# Patient Record
Sex: Male | Born: 1940
Health system: Southern US, Community
[De-identification: ages and names within clinical notes are randomized; demographics above are authoritative.]

## PROBLEM LIST (undated history)

## (undated) DIAGNOSIS — H409 Unspecified glaucoma: Secondary | ICD-10-CM

## (undated) DIAGNOSIS — I10 Essential (primary) hypertension: Secondary | ICD-10-CM

## (undated) HISTORY — PX: EYE SURGERY: SHX253

## (undated) HISTORY — DX: Essential (primary) hypertension: I10

## (undated) HISTORY — DX: Unspecified glaucoma: H40.9

---

## 2012-05-04 DIAGNOSIS — H2510 Age-related nuclear cataract, unspecified eye: Secondary | ICD-10-CM | POA: Diagnosis not present

## 2013-05-09 DIAGNOSIS — H40019 Open angle with borderline findings, low risk, unspecified eye: Secondary | ICD-10-CM | POA: Diagnosis not present

## 2013-05-09 DIAGNOSIS — H2510 Age-related nuclear cataract, unspecified eye: Secondary | ICD-10-CM | POA: Diagnosis not present

## 2014-06-13 DIAGNOSIS — H2510 Age-related nuclear cataract, unspecified eye: Secondary | ICD-10-CM | POA: Diagnosis not present

## 2014-06-13 DIAGNOSIS — H40019 Open angle with borderline findings, low risk, unspecified eye: Secondary | ICD-10-CM | POA: Diagnosis not present

## 2014-06-13 DIAGNOSIS — H11159 Pinguecula, unspecified eye: Secondary | ICD-10-CM | POA: Diagnosis not present

## 2014-06-26 DIAGNOSIS — H2510 Age-related nuclear cataract, unspecified eye: Secondary | ICD-10-CM | POA: Diagnosis not present

## 2014-06-26 DIAGNOSIS — H40019 Open angle with borderline findings, low risk, unspecified eye: Secondary | ICD-10-CM | POA: Diagnosis not present

## 2014-10-12 HISTORY — PX: DENTAL SURGERY: SHX609

## 2015-03-22 ENCOUNTER — Encounter: Payer: Self-pay | Admitting: Emergency Medicine

## 2015-03-22 ENCOUNTER — Emergency Department (INDEPENDENT_AMBULATORY_CARE_PROVIDER_SITE_OTHER)
Admission: EM | Admit: 2015-03-22 | Discharge: 2015-03-22 | Disposition: A | Discharge status: Home / Self Care | Payer: Medicare Other | Source: Home / Self Care | Attending: Family Medicine | Admitting: Family Medicine

## 2015-03-22 DIAGNOSIS — M795 Residual foreign body in soft tissue: Secondary | ICD-10-CM | POA: Diagnosis not present

## 2015-03-22 DIAGNOSIS — W57XXXA Bitten or stung by nonvenomous insect and other nonvenomous arthropods, initial encounter: Principal | ICD-10-CM

## 2015-03-22 DIAGNOSIS — S40861A Insect bite (nonvenomous) of right upper arm, initial encounter: Secondary | ICD-10-CM | POA: Diagnosis not present

## 2015-03-22 MED ORDER — MUPIROCIN 2 % EX OINT: 1.0000 "application " | TOPICAL_OINTMENT | Freq: Three times a day (TID) | CUTANEOUS | Status: DC

## 2015-03-22 NOTE — ED Provider Notes (Signed)
CSN: [id number]     Arrival date & time 03/22/15  1439 History   First MD Initiated Contact with Patient 03/22/15 1516     Chief Complaint  Patient presents with  . Tick Removal      HPI Comments: Patient felt a nodule in his right axilla about a week ago and believed that it was a skin tag.  There has been no pain or swelling.  No fevers, chills, and sweats.  He feels well.  He denies rash.  The history is provided by the patient.    History reviewed. No pertinent past medical history. Past Surgical History  Procedure Laterality Date  . Dental surgery     Family History  Problem Relation Age of Onset  . Stroke Mother    History  Substance Use Topics  . Smoking status: Never Smoker   . Smokeless tobacco: Not on file  . Alcohol Use: No    Review of Systems  Constitutional: Negative for fever, chills, diaphoresis, activity change, appetite change and fatigue.  HENT: Negative.   Eyes: Negative.   Respiratory: Negative.   Cardiovascular: Negative.   Gastrointestinal: Negative.   Genitourinary: Negative.   Musculoskeletal: Negative for myalgias, joint swelling and arthralgias.  Skin: Negative for rash.       Nodule right axilla  Neurological: Negative for headaches.    Allergies  Review of patient's allergies indicates no known allergies.  Home Medications   Prior to Admission medications   Medication Sig Start Date End Date Taking? Authorizing Provider  mupirocin ointment (BACTROBAN) 2 % Apply 1 application topically 3 (three) times daily. 03/22/15   Lattie Haw, MD   BP 168/76 mmHg  Pulse 70  Temp(Src) 98.7 F (37.1 C) (Oral)  Resp 18  Ht 5\' 10"  (1.778 m)  Wt 171 lb (77.565 kg)  BMI 24.54 kg/m2  SpO2 96% Physical Exam Nursing notes and Vital Signs reviewed. Appearance:  Patient appears stated age, and in no acute distress Eyes:  Pupils are equal, round, and reactive to light and accomodation.   Nose:  Normal Pharynx:  Normal Neck:  Supple.  No  adenopathy  Lungs:  Clear to auscultation.  Breath sounds are equal.  Heart:  Regular rate and rhythm without murmurs, rubs, or gallops.  Abdomen:  Nontender  Extremities:  No edema.   Skin:  No rash present.  Right axilla reveals a large embedded and engorged tick (approximately 22mm diameter; suggestive of dog tick).  No erythema, swelling or tenderness to palpation around bite site. ED Course  Procedures :  Foreign Body Removal: Discussed benefits and risks of procedure and verbal consent obtained. Using sterile technique and local 1% lidocaine with epinephrine, cleansed area around tick with Betadine and alcohol.  Removed tick with forceps.  Using a #15 blade, debrided tick site to remove mouth parts.  Bacitracin and non-stick sterile dressing applied.  Wound precautions explained to patient.      Labs Reviewed  ROCKY MTN SPOTTED FVR ABS PNL(IGG+IGM)  B. BURGDORFI ANTIBODIES         MDM   1. Tick bite of axillary region, right, initial encounter    Although bite site does not look infected, will begin Bactroban ointment until healed. Check RMSF and Lyme disease titers.    Lattie Haw, MD 03/30/15 [phone]

## 2015-03-22 NOTE — Discharge Instructions (Signed)
Change bandage daily until healed.   Lyme Disease You may have been bitten by a tick and are to watch for the development of Lyme Disease. Lyme Disease is an infection that is caused by a bacteria The bacteria causing this disease is named Borreilia burgdorferi. If a tick is infected with this bacteria and then bites you, then Lyme Disease may occur. These ticks are carried by deer and rodents such as rabbits and mice and infest grassy as well as forested areas. Fortunately most tick bites do not cause Lyme Disease.  Lyme Disease is easier to prevent than to treat. First, covering your legs with clothing when walking in areas where ticks are possibly abundant will prevent their attachment because ticks tend to stay within inches of the ground. Second, using insecticides containing DEET can be applied on skin or clothing. Last, because it takes about 12 to 24 hours for the tick to transmit the disease after attachment to the human host, you should inspect your body for ticks twice a day when you are in areas where Lyme Disease is common. You must look thoroughly when searching for ticks. The Ixodes tick that carries Lyme Disease is very small. It is around the size of a sesame seed (picture of tick is not actual size). Removal is best done by grasping the tick by the head and pulling it out. Do not to squeeze the body of the tick. This could inject the infecting bacteria into the bite site. Wash the area of the bite with an antiseptic solution after removal.  Lyme Disease is a disease that may affect many body systems. Because of the small size of the biting tick, most people do not notice being bitten. The first sign of an infection is usually a round red rash that extends out from the center of the tick bite. The center of the lesion may be blood colored (hemorrhagic) or have tiny blisters (vesicular). Most lesions have bright red outer borders and partial central clearing. This rash may extend out many  inches in diameter, and multiple lesions may be present. Other symptoms such as fatigue, headaches, chills and fever, general achiness and swelling of lymph glands may also occur. If this first stage of the disease is left untreated, these symptoms may gradually resolve by themselves, or progressive symptoms may occur because of spread of infection to other areas of the body.  Follow up with your caregiver to have testing and treatment if you have a tick bite and you develop any of the above complaints. Your caregiver may recommend preventative (prophylactic) medications which kill bacteria (antibiotics). Once a diagnosis of Lyme Disease is made, antibiotic treatment is highly likely to cure the disease. Effective treatment of late stage Lyme Disease may require longer courses of antibiotic therapy.  MAKE SURE YOU:   Understand these instructions.  Will watch your condition.  Will get help right away if you are not doing well or get worse. Document Released: 01/04/2001 Document Revised: 12/21/2011 Document Reviewed: 03/08/2009 [facility] Patient Information 2015 [place], [place]. This information is not intended to replace advice given to you by your health care provider. Make sure you discuss any questions you have with your health care provider.

## 2015-03-22 NOTE — ED Notes (Signed)
Reports feeling dangling lump under upper right arm for over a week; upon visualization, it is an engorged tick.

## 2015-03-25 LAB — ROCKY MTN SPOTTED FVR ABS PNL(IGG+IGM)
RMSF IGG: 0.15 IV
RMSF IGM: 0.46 IV

## 2015-03-25 LAB — B. BURGDORFI ANTIBODIES: B burgdorferi Ab IgG+IgM: 0.31 {ISR}

## 2015-03-26 ENCOUNTER — Telehealth: Payer: Self-pay | Admitting: *Deleted

## 2015-07-02 DIAGNOSIS — H4011X2 Primary open-angle glaucoma, moderate stage: Secondary | ICD-10-CM | POA: Diagnosis not present

## 2015-07-02 DIAGNOSIS — H2513 Age-related nuclear cataract, bilateral: Secondary | ICD-10-CM | POA: Diagnosis not present

## 2015-07-02 DIAGNOSIS — H4011X1 Primary open-angle glaucoma, mild stage: Secondary | ICD-10-CM | POA: Diagnosis not present

## 2015-08-13 DIAGNOSIS — H401121 Primary open-angle glaucoma, left eye, mild stage: Secondary | ICD-10-CM | POA: Diagnosis not present

## 2015-08-13 DIAGNOSIS — H401112 Primary open-angle glaucoma, right eye, moderate stage: Secondary | ICD-10-CM | POA: Diagnosis not present

## 2015-08-13 DIAGNOSIS — H2513 Age-related nuclear cataract, bilateral: Secondary | ICD-10-CM | POA: Diagnosis not present

## 2016-02-11 DIAGNOSIS — H401121 Primary open-angle glaucoma, left eye, mild stage: Secondary | ICD-10-CM | POA: Diagnosis not present

## 2016-02-11 DIAGNOSIS — H401112 Primary open-angle glaucoma, right eye, moderate stage: Secondary | ICD-10-CM | POA: Diagnosis not present

## 2016-04-03 ENCOUNTER — Encounter: Payer: Self-pay | Admitting: Emergency Medicine

## 2016-04-03 ENCOUNTER — Emergency Department (INDEPENDENT_AMBULATORY_CARE_PROVIDER_SITE_OTHER)
Admission: EM | Admit: 2016-04-03 | Discharge: 2016-04-03 | Disposition: A | Discharge status: Home / Self Care | Payer: Medicare Other | Source: Home / Self Care

## 2016-04-03 DIAGNOSIS — H6982 Other specified disorders of Eustachian tube, left ear: Secondary | ICD-10-CM

## 2016-04-03 NOTE — ED Notes (Addendum)
Pt c/o bilateral ear fullness and decrease in hearing x1 week. He has been using OTC drops for wax buildup.

## 2016-04-03 NOTE — ED Provider Notes (Signed)
CSN: [id number]     Arrival date & time 04/03/16  1409 History   None    Chief Complaint  Patient presents with  . Ear Fullness      HPI Comments: Patient complains of his left ear feeling clogged and hearing decreased.  No recent URI or sinus congestion.  The history is provided by the patient.    History reviewed. No pertinent past medical history. Past Surgical History  Procedure Laterality Date  . Dental surgery     Family History  Problem Relation Age of Onset  . Stroke Mother    Social History  Substance Use Topics  . Smoking status: Never Smoker   . Smokeless tobacco: None  . Alcohol Use: No    Review of Systems No sore throat No cough No pleuritic pain No wheezing ? nasal congestion ? post-nasal drainage No sinus pain/pressure No itchy/red eyes ? earache No hemoptysis No SOB No fever/chills No nausea No vomiting No abdominal pain No diarrhea No urinary symptoms No skin rash No fatigue No myalgias No headache  Allergies  Review of patient's allergies indicates no known allergies.  Home Medications   Prior to Admission medications   Medication Sig Start Date End Date Taking? Authorizing Provider  mupirocin ointment (BACTROBAN) 2 % Apply 1 application topically 3 (three) times daily. 03/22/15   Lattie HawStephen A Beese, MD   Meds Ordered and Administered this Visit  Medications - No data to display  BP 165/83 mmHg  Pulse 58  Temp(Src) 98.4 F (36.9 C) (Oral)  Wt 182 lb (82.555 kg)  SpO2 97% No data found.   Physical Exam Nursing notes and Vital Signs reviewed. Appearance:  Patient appears stated age, and in no acute distress Eyes:  Pupils are equal, round, and reactive to light and accomodation.  Extraocular movement is intact.  Conjunctivae are not inflamed  Ears:  Canals normal.  Tympanic membranes normal.  Nose:  Normal turbinates.  No sinus tenderness.   Pharynx:  Normal Neck:  Supple.  No adenopathy Lungs:  Clear to auscultation.  Breath  sounds are equal.  Moving air well. Heart:  Regular rate and rhythm without murmurs, rubs, or gallops.  Abdomen:  Nontender without masses or hepatosplenomegaly.  Bowel sounds are present.  No CVA or flank tenderness.  Extremities:  No edema.  Skin:  No rash present.   ED Course  Procedures none    Labs Reviewed   Tympanometry:  Normal both ears    MDM   1. Eustachian tube dysfunction, left    Interestingly, patient notes that his ears felt clear after tympanometry.  May take Pseudoephedrine (30mg , one or two every 4 to 6 hours) for sinus congestion.  Get adequate rest.   Recommend trial of Flonase nasal spray each morning. Followup with ENT if not improving.    Lattie HawStephen A Beese, MD 04/11/16 [phone]

## 2016-04-03 NOTE — Discharge Instructions (Signed)
May take Pseudoephedrine (30mg , one or two every 4 to 6 hours) for sinus congestion.  Get adequate rest.   Recommend trial of Flonase nasal spray each morning.

## 2016-08-26 DIAGNOSIS — H2513 Age-related nuclear cataract, bilateral: Secondary | ICD-10-CM | POA: Diagnosis not present

## 2016-08-26 DIAGNOSIS — H401112 Primary open-angle glaucoma, right eye, moderate stage: Secondary | ICD-10-CM | POA: Diagnosis not present

## 2017-02-23 DIAGNOSIS — H401111 Primary open-angle glaucoma, right eye, mild stage: Secondary | ICD-10-CM | POA: Diagnosis not present

## 2017-02-23 DIAGNOSIS — H401122 Primary open-angle glaucoma, left eye, moderate stage: Secondary | ICD-10-CM | POA: Diagnosis not present

## 2017-02-23 DIAGNOSIS — H25813 Combined forms of age-related cataract, bilateral: Secondary | ICD-10-CM | POA: Diagnosis not present

## 2017-04-06 DIAGNOSIS — H401121 Primary open-angle glaucoma, left eye, mild stage: Secondary | ICD-10-CM | POA: Diagnosis not present

## 2017-04-06 DIAGNOSIS — H401112 Primary open-angle glaucoma, right eye, moderate stage: Secondary | ICD-10-CM | POA: Diagnosis not present

## 2017-07-20 DIAGNOSIS — H25812 Combined forms of age-related cataract, left eye: Secondary | ICD-10-CM | POA: Diagnosis not present

## 2017-07-20 DIAGNOSIS — H401121 Primary open-angle glaucoma, left eye, mild stage: Secondary | ICD-10-CM | POA: Diagnosis not present

## 2017-07-20 DIAGNOSIS — H2511 Age-related nuclear cataract, right eye: Secondary | ICD-10-CM | POA: Diagnosis not present

## 2017-07-20 DIAGNOSIS — H04123 Dry eye syndrome of bilateral lacrimal glands: Secondary | ICD-10-CM | POA: Diagnosis not present

## 2017-07-20 DIAGNOSIS — H401112 Primary open-angle glaucoma, right eye, moderate stage: Secondary | ICD-10-CM | POA: Diagnosis not present

## 2018-01-18 DIAGNOSIS — H25813 Combined forms of age-related cataract, bilateral: Secondary | ICD-10-CM | POA: Diagnosis not present

## 2018-01-18 DIAGNOSIS — H1851 Endothelial corneal dystrophy: Secondary | ICD-10-CM | POA: Diagnosis not present

## 2018-01-18 DIAGNOSIS — H04123 Dry eye syndrome of bilateral lacrimal glands: Secondary | ICD-10-CM | POA: Diagnosis not present

## 2018-01-18 DIAGNOSIS — H401112 Primary open-angle glaucoma, right eye, moderate stage: Secondary | ICD-10-CM | POA: Diagnosis not present

## 2018-01-18 DIAGNOSIS — H401121 Primary open-angle glaucoma, left eye, mild stage: Secondary | ICD-10-CM | POA: Diagnosis not present

## 2018-02-21 DIAGNOSIS — H401121 Primary open-angle glaucoma, left eye, mild stage: Secondary | ICD-10-CM | POA: Diagnosis not present

## 2018-02-21 DIAGNOSIS — H401112 Primary open-angle glaucoma, right eye, moderate stage: Secondary | ICD-10-CM | POA: Diagnosis not present

## 2018-02-28 ENCOUNTER — Ambulatory Visit (INDEPENDENT_AMBULATORY_CARE_PROVIDER_SITE_OTHER): Payer: Medicare Other | Admitting: Physician Assistant

## 2018-02-28 ENCOUNTER — Encounter: Payer: Self-pay | Admitting: Physician Assistant

## 2018-02-28 ENCOUNTER — Encounter (INDEPENDENT_AMBULATORY_CARE_PROVIDER_SITE_OTHER): Payer: Self-pay

## 2018-02-28 VITALS — BP 197/87 | HR 70 | Wt 177.0 lb

## 2018-02-28 DIAGNOSIS — Z974 Presence of external hearing-aid: Secondary | ICD-10-CM

## 2018-02-28 DIAGNOSIS — Z7689 Persons encountering health services in other specified circumstances: Secondary | ICD-10-CM

## 2018-02-28 DIAGNOSIS — Z5320 Procedure and treatment not carried out because of patient's decision for unspecified reasons: Secondary | ICD-10-CM | POA: Diagnosis not present

## 2018-02-28 DIAGNOSIS — Z13220 Encounter for screening for lipoid disorders: Secondary | ICD-10-CM

## 2018-02-28 DIAGNOSIS — Z2821 Immunization not carried out because of patient refusal: Secondary | ICD-10-CM

## 2018-02-28 DIAGNOSIS — Z1329 Encounter for screening for other suspected endocrine disorder: Secondary | ICD-10-CM

## 2018-02-28 DIAGNOSIS — I10 Essential (primary) hypertension: Secondary | ICD-10-CM

## 2018-02-28 DIAGNOSIS — H409 Unspecified glaucoma: Secondary | ICD-10-CM | POA: Insufficient documentation

## 2018-02-28 DIAGNOSIS — Z130 Encounter for screening for diseases of the blood and blood-forming organs and certain disorders involving the immune mechanism: Secondary | ICD-10-CM

## 2018-02-28 DIAGNOSIS — Z1389 Encounter for screening for other disorder: Secondary | ICD-10-CM

## 2018-02-28 HISTORY — DX: Immunization not carried out because of patient refusal: Z28.21

## 2018-02-28 HISTORY — DX: Presence of external hearing-aid: Z97.4

## 2018-02-28 HISTORY — DX: Procedure and treatment not carried out because of patient's decision for unspecified reasons: Z53.20

## 2018-02-28 HISTORY — DX: Essential (primary) hypertension: I10

## 2018-02-28 MED ORDER — AMLODIPINE BESYLATE 10 MG PO TABS: 10.0000 mg | ORAL_TABLET | Freq: Every day | ORAL | 3 refills | Status: DC

## 2018-02-28 NOTE — Progress Notes (Signed)
HPI:                                                                Cody Stephens is a 77 y.o. male who presents to [facility]: [facility] today to establish care  Current concerns: hypertension  HTN: new onset. he was told his BP was elevated by his dentist a few weeks ago. Began monitoring BP at home. Endorses some intermittent dizziness. He currently square dancing 3-4 days per week, denies exertional chest pain, dyspnea, palpitations, or syncope. Family history of HTN in his mother. Risk factors: male sex, age>55  Home BP Readings 185/97 195/101 174/103 166/93 183/106 169/93 179/103 181/96 156/93 172/93  Depression screen [facility] 2/9 02/28/2018 02/28/2018  Decreased Interest 0 0  Down, Depressed, Hopeless 0 0  PHQ - 2 Score 0 0    No flowsheet data found.    History reviewed. No pertinent past medical history. Past Surgical History:  Procedure Laterality Date  . DENTAL SURGERY    . DENTAL SURGERY  2016   implant   Social History   Tobacco Use  . Smoking status: Never Smoker  . Smokeless tobacco: Never Used  Substance Use Topics  . Alcohol use: Yes    Alcohol/week: 0.6 oz    Types: 1 Standard drinks or equivalent per week   family history includes Hypertension in his mother.    ROS: negative except as noted in the HPI  Medications: Current Outpatient Medications  Medication Sig Dispense Refill  . latanoprost (XALATAN) 0.005 % ophthalmic solution 1 drop at bedtime.    Marland Kitchen amLODipine (NORVASC) 10 MG tablet Take 1 tablet (10 mg total) by mouth daily. 30 tablet 3   No current facility-administered medications for this visit.    No Known Allergies     Objective:  BP (!) 197/87   Pulse 70   Wt 177 lb (80.3 kg)   BMI 25.40 kg/m  Gen:  alert, not ill-appearing, no distress, appropriate for age HEENT: head normocephalic without obvious abnormality, conjunctiva and cornea clear, wearing glasses, wearing bilateral  hearing aids, trachea midline Pulm: Normal work of breathing, normal phonation, clear to auscultation bilaterally, no wheezes, rales or rhonchi CV: Normal rate, regular rhythm, s1 and s2 distinct, no murmurs, clicks or rubs; radial pulses 2+ symmetric Neuro: alert and oriented x 3, no tremor MSK: extremities atraumatic, normal gait and station, no peripheral edema Skin: intact, no rashes on exposed skin, no jaundice, no cyanosis Psych: well-groomed, cooperative, good eye contact, euthymic mood, affect mood-congruent, speech is articulate, and thought processes clear and goal-directed  ECG 02/28/2018 4:06 PM Rate 62 bpm PR-I 158 ms QRS 88 ms QT/QTc 428/434   No results found for this or any previous visit (from the past 72 hour(s)). No results found.    Assessment and Plan: 77 y.o. male with   Encounter to establish care  Uncontrolled stage 2 hypertension - Plan: COMPLETE METABOLIC PANEL WITH GFR, Urinalysis, Routine w reflex microscopic, amLODipine (NORVASC) 10 MG tablet  Screening for lipid disorders - Plan: Lipid Panel w/reflex Direct LDL  Screening for thyroid disorder - Plan: TSH + free T4  Screening for blood disease - Plan: COMPLETE METABOLIC PANEL WITH GFR, CBC  Screening for blood or protein  in urine - Plan: Urinalysis, Routine w reflex microscopic  Wears hearing aid in both ears  Colon cancer screening declined  Refused pneumococcal vaccination  - Personally reviewed PMH, PSH, PFH, medications, allergies, HM - Age-appropriate cancer screening: declines colon cancer screening - Influenza n/a - Tdap declined - Prevnar declined - PHQ2 negative  Uncontrolled Hypertension BP Readings from Last 3 Encounters:  02/28/18 (!) 197/87  04/03/16 165/83  03/22/15 168/76  - patient states this is a new problem, but historical BP readings are hypertensive - starting Amlodipine 10 mg daily - checking CMP, UA - ECG NSR today - counseled on therapeutic lifestyle  changes   Patient education and anticipatory guidance given Patient agrees with treatment plan Follow-up in 2 weeks for nurse BP check, then every 6 months or sooner as needed if symptoms worsen or fail to improve  Levonne Hubert PA-C

## 2018-02-28 NOTE — Addendum Note (Signed)
Addended by: Thom Chimes on: 02/28/2018 04:13 PM   Modules accepted: Orders

## 2018-02-28 NOTE — Patient Instructions (Addendum)
For your blood pressure: - Goal <140/90 - monitor and log blood pressures at home - check around the same time each day in a relaxed setting - Limit salt to <2000 mg/day - Follow DASH eating plan - limit alcohol to 2 standard drinks per day for men and 1 per day for women - avoid tobacco products - weight loss: 7% of current body weight - follow-up in 2 weeks for nurse BP check. Bring your home readings and cuff. Make sure you have taken your medication at least 1 hour prior, are well hydrated, and have not had any caffeine or nicotine products.   Managing Your Hypertension Hypertension is commonly called high blood pressure. This is when the force of your blood pressing against the walls of your arteries is too strong. Arteries are blood vessels that carry blood from your heart throughout your body. Hypertension forces the heart to work harder to pump blood, and may cause the arteries to become narrow or stiff. Having untreated or uncontrolled hypertension can cause heart attack, stroke, kidney disease, and other problems. What are blood pressure readings? A blood pressure reading consists of a higher number over a lower number. Ideally, your blood pressure should be below 120/80. The first ("top") number is called the systolic pressure. It is a measure of the pressure in your arteries as your heart beats. The second ("bottom") number is called the diastolic pressure. It is a measure of the pressure in your arteries as the heart relaxes. What does my blood pressure reading mean? Blood pressure is classified into four stages. Based on your blood pressure reading, your health care provider may use the following stages to determine what type of treatment you need, if any. Systolic pressure and diastolic pressure are measured in a unit called mm Hg. Normal  Systolic pressure: below 120.  Diastolic pressure: below 80. Elevated  Systolic pressure: 120-129.  Diastolic pressure: below  80. Hypertension stage 1  Systolic pressure: 130-139.  Diastolic pressure: 80-89. Hypertension stage 2  Systolic pressure: 140 or above.  Diastolic pressure: 90 or above. What health risks are associated with hypertension? Managing your hypertension is an important responsibility. Uncontrolled hypertension can lead to:  A heart attack.  A stroke.  A weakened blood vessel (aneurysm).  Heart failure.  Kidney damage.  Eye damage.  Metabolic syndrome.  Memory and concentration problems.  What changes can I make to manage my hypertension? Hypertension can be managed by making lifestyle changes and possibly by taking medicines. Your health care provider will help you make a plan to bring your blood pressure within a normal range. Eating and drinking  Eat a diet that is high in fiber and potassium, and low in salt (sodium), added sugar, and fat. An example eating plan is called the DASH (Dietary Approaches to Stop Hypertension) diet. To eat this way: ? Eat plenty of fresh fruits and vegetables. Try to fill half of your plate at each meal with fruits and vegetables. ? Eat whole grains, such as whole wheat pasta, brown rice, or whole grain bread. Fill about one quarter of your plate with whole grains. ? Eat low-fat diary products. ? Avoid fatty cuts of meat, processed or cured meats, and poultry with skin. Fill about one quarter of your plate with lean proteins such as fish, chicken without skin, beans, eggs, and tofu. ? Avoid premade and processed foods. These tend to be higher in sodium, added sugar, and fat.  Reduce your daily sodium intake. Most people with  hypertension should eat less than 1,500 mg of sodium a day.  Limit alcohol intake to no more than 1 drink a day for nonpregnant women and 2 drinks a day for men. One drink equals 12 oz of beer, 5 oz of wine, or 1 oz of hard liquor. Lifestyle  Work with your health care provider to maintain a healthy body weight, or to  lose weight. Ask what an ideal weight is for you.  Get at least 30 minutes of exercise that causes your heart to beat faster (aerobic exercise) most days of the week. Activities may include walking, swimming, or biking.  Include exercise to strengthen your muscles (resistance exercise), such as weight lifting, as part of your weekly exercise routine. Try to do these types of exercises for 30 minutes at least 3 days a week.  Do not use any products that contain nicotine or tobacco, such as cigarettes and e-cigarettes. If you need help quitting, ask your health care provider.  Control any long-term (chronic) conditions you have, such as high cholesterol or diabetes. Monitoring  Monitor your blood pressure at home as told by your health care provider. Your personal target blood pressure may vary depending on your medical conditions, your age, and other factors.  Have your blood pressure checked regularly, as often as told by your health care provider. Working with your health care provider  Review all the medicines you take with your health care provider because there may be side effects or interactions.  Talk with your health care provider about your diet, exercise habits, and other lifestyle factors that may be contributing to hypertension.  Visit your health care provider regularly. Your health care provider can help you create and adjust your plan for managing hypertension. Will I need medicine to control my blood pressure? Your health care provider may prescribe medicine if lifestyle changes are not enough to get your blood pressure under control, and if:  Your systolic blood pressure is 130 or higher.  Your diastolic blood pressure is 80 or higher.  Take medicines only as told by your health care provider. Follow the directions carefully. Blood pressure medicines must be taken as prescribed. The medicine does not work as well when you skip doses. Skipping doses also puts you at risk  for problems. Contact a health care provider if:  You think you are having a reaction to medicines you have taken.  You have repeated (recurrent) headaches.  You feel dizzy.  You have swelling in your ankles.  You have trouble with your vision. Get help right away if:  You develop a severe headache or confusion.  You have unusual weakness or numbness, or you feel faint.  You have severe pain in your chest or abdomen.  You vomit repeatedly.  You have trouble breathing. Summary  Hypertension is when the force of blood pumping through your arteries is too strong. If this condition is not controlled, it may put you at risk for serious complications.  Your personal target blood pressure may vary depending on your medical conditions, your age, and other factors. For most people, a normal blood pressure is less than 120/80.  Hypertension is managed by lifestyle changes, medicines, or both. Lifestyle changes include weight loss, eating a healthy, low-sodium diet, exercising more, and limiting alcohol. This information is not intended to replace advice given to you by your health care provider. Make sure you discuss any questions you have with your health care provider. Document Released: 06/22/2012 Document Revised: 08/26/2016 Document  Reviewed: 08/26/2016 Elsevier Interactive Patient Education  [employer].

## 2018-03-01 LAB — COMPLETE METABOLIC PANEL WITH GFR
AG Ratio: 1.7 (calc) (ref 1.0–2.5)
ALKALINE PHOSPHATASE (APISO): 68 U/L (ref 40–115)
ALT: 7 U/L — AB (ref 9–46)
AST: 15 U/L (ref 10–35)
Albumin: 4.2 g/dL (ref 3.6–5.1)
BUN: 16 mg/dL (ref 7–25)
CALCIUM: 9.3 mg/dL (ref 8.6–10.3)
CO2: 29 mmol/L (ref 20–32)
Chloride: 107 mmol/L (ref 98–110)
Creat: 1.07 mg/dL (ref 0.70–1.18)
GFR, EST NON AFRICAN AMERICAN: 68 mL/min/{1.73_m2} (ref >=60)
GFR, Est African American: 79 mL/min/{1.73_m2} (ref >=60)
Globulin: 2.5 g/dL (calc) (ref 1.9–3.7)
Glucose, Bld: 87 mg/dL (ref 65–99)
Potassium: 4 mmol/L (ref 3.5–5.3)
SODIUM: 141 mmol/L (ref 135–146)
Total Bilirubin: 0.4 mg/dL (ref 0.2–1.2)
Total Protein: 6.7 g/dL (ref 6.1–8.1)

## 2018-03-01 LAB — URINALYSIS, ROUTINE W REFLEX MICROSCOPIC
Bilirubin Urine: NEGATIVE
GLUCOSE, UA: NEGATIVE
Hgb urine dipstick: NEGATIVE
Ketones, ur: NEGATIVE
Leukocytes, UA: NEGATIVE
NITRITE: NEGATIVE
Protein, ur: NEGATIVE
SPECIFIC GRAVITY, URINE: 1.015 (ref 1.001–1.03)
pH: 5.5 (ref 5.0–8.0)

## 2018-03-01 LAB — LIPID PANEL W/REFLEX DIRECT LDL
CHOLESTEROL: 191 mg/dL (ref <200)
HDL: 51 mg/dL (ref >40)
LDL Cholesterol (Calc): 121 mg/dL (calc) — ABNORMAL HIGH
Non-HDL Cholesterol (Calc): 140 mg/dL (calc) — ABNORMAL HIGH (ref <130)
TRIGLYCERIDES: 91 mg/dL (ref <150)
Total CHOL/HDL Ratio: 3.7 (calc) (ref <5.0)

## 2018-03-01 LAB — CBC
HCT: 46.8 % (ref 38.5–50.0)
Hemoglobin: 16.5 g/dL (ref 13.2–17.1)
MCH: 31.6 pg (ref 27.0–33.0)
MCHC: 35.3 g/dL (ref 32.0–36.0)
MCV: 89.7 fL (ref 80.0–100.0)
MPV: 11 fL (ref 7.5–12.5)
PLATELETS: 213 10*3/uL (ref 140–400)
RBC: 5.22 10*6/uL (ref 4.20–5.80)
RDW: 13 % (ref 11.0–15.0)
WBC: 6.7 10*3/uL (ref 3.8–10.8)

## 2018-03-01 LAB — TSH+FREE T4: TSH W/REFLEX TO FT4: 3.14 mIU/L (ref 0.40–4.50)

## 2018-03-01 NOTE — Progress Notes (Signed)
Good afternoon Renae Fickle,  Your labs look great!  - normal kidney function - cholesterol in a healthy range - normal blood counts - no evidence of diabetes - no evidence of thyroid disease  Best, Vinetta Bergamo

## 2018-03-04 ENCOUNTER — Ambulatory Visit: Payer: Medicare Other | Admitting: Physician Assistant

## 2018-03-14 ENCOUNTER — Ambulatory Visit (INDEPENDENT_AMBULATORY_CARE_PROVIDER_SITE_OTHER): Payer: Medicare Other | Admitting: Physician Assistant

## 2018-03-14 VITALS — BP 156/77 | HR 71 | Ht 70.0 in | Wt 178.0 lb

## 2018-03-14 DIAGNOSIS — I10 Essential (primary) hypertension: Secondary | ICD-10-CM

## 2018-03-14 NOTE — Progress Notes (Signed)
   Subjective:    Patient ID: Cody Stephens, male    DOB: 07/29/1941, 77 y.o.   MRN: [id number]  HPI Pt is here for recheck BP. Pt denies any medication problems. Denies H/A, dizziness,palpitations or SOB. Pt states square dances for exercise.  Advised pt to make sure to try and avoid salt in foods and avoid processed foods. Pt agrees.  Home BP Readings: 133/72 128/76 136/83 131/79 131/87 143/82 Home BP cuff read was 161/80  Review of Systems     Objective:   Physical Exam        Assessment & Plan:  Advised pt to follow up with MD in 1 month and if has any problems or sees BP readings trending up to please call office back for sooner appt. Advised pt I feel like he may have some white coat syndrome as his home BP readings were good.  Agree with above plan. Tandy GawJade Breeback PA-C

## 2018-04-11 ENCOUNTER — Ambulatory Visit (INDEPENDENT_AMBULATORY_CARE_PROVIDER_SITE_OTHER): Payer: Medicare Other | Admitting: Physician Assistant

## 2018-04-11 ENCOUNTER — Encounter: Payer: Self-pay | Admitting: Physician Assistant

## 2018-04-11 VITALS — BP 142/77 | HR 70 | Wt 179.0 lb

## 2018-04-11 DIAGNOSIS — I10 Essential (primary) hypertension: Secondary | ICD-10-CM | POA: Diagnosis not present

## 2018-04-11 NOTE — Progress Notes (Signed)
HPI:                                                                Cody Stephens is a 77 y.o. male who [facility] today for hypertension follow-up  HTN: began taking Amlodipine 10 mg about 6 weeks ago on 03/01/18. Compliant with medications. Checks BP's at home. BP range 116-155/71-91. BP is mostly in range. He does hold his medication on days when morning BP is <120/80. Denies vision change, headache, chest pain with exertion, orthopnea, lightheadedness, syncope and edema. Risk factors include: 83age>55, male sex    [facility] 2/9 02/28/2018 02/28/2018  Decreased Interest 0 0  Down, Depressed, Hopeless 0 0  PHQ - 2 Score 0 0    No flowsheet data found.    Past Medical History:  Diagnosis Date  . Hypertension    Past Surgical History:  Procedure Laterality Date  . DENTAL SURGERY    . DENTAL SURGERY  2016   implant   Social History   Tobacco Use  . Smoking status: Never Smoker  . Smokeless tobacco: Never Used  Substance Use Topics  . Alcohol use: Yes    Alcohol/week: 0.6 oz    Types: 1 Standard drinks or equivalent per week   family history includes Hypertension in his mother.    ROS: negative except as noted in the HPI  Medications: Current Outpatient Medications  Medication Sig Dispense Refill  . amLODipine (NORVASC) 10 MG tablet Take 1 tablet (10 mg total) by mouth daily. 30 tablet 3  . latanoprost (XALATAN) 0.005 % ophthalmic solution 1 drop at bedtime.     No current facility-administered medications for this visit.    No Known Allergies     Objective:  BP (!) 142/77   Pulse 70   Wt 179 lb (81.2 kg)   BMI 25.68 kg/m  Gen:  alert, not ill-appearing, no distress, appropriate for age HEENT: head normocephalic without obvious abnormality, conjunctiva and cornea clear, wearing glasses, trachea midline Pulm: Normal work of breathing, normal phonation, clear to auscultation  bilaterally, no wheezes, rales or rhonchi CV: Normal rate, regular rhythm, s1 and s2 distinct, grade II/VI systolic murmur heard best at the apex Neuro: alert and oriented x 3, no tremor MSK: extremities atraumatic, normal gait and station, trace peripheral edema Skin: intact, no rashes on exposed skin, no jaundice, no cyanosis Psych: well-groomed, cooperative, good eye contact, euthymic mood, affect mood-congruent, speech is articulate, and thought processes clear and goal-directed    No results found for this or any previous visit (from the past 72 hour(s)). No results found.    Assessment and Plan: 77 y.o. male with   Hypertension goal BP (blood pressure) < 140/90 - continue Amlodipine 10 mg daily - cont to monitor BP at home - DASH eating plan   Patient education and anticipatory guidance given Patient agrees with treatment plan Follow-up in 6 months or sooner as needed if symptoms worsen or fail to improve  Levonne Hubertharley E. Yamilee Harmes PA-C

## 2018-04-11 NOTE — Patient Instructions (Addendum)
For your blood pressure: - Goal <140/90 - continue your Amlodipine 10 mg every morning. Okay to hold if BP is <120/80 - monitor and log blood pressures at home - check around the same time each day in a relaxed setting - Limit salt to <2000 mg/day - Follow DASH eating plan - limit alcohol to 2 standard drinks per day for men and 1 per day for women - avoid tobacco products - weight loss: 7% of current body weight - follow-up every 6 months for your blood pressure    DASH Eating Plan DASH stands for "Dietary Approaches to Stop Hypertension." The DASH eating plan is a healthy eating plan that has been shown to reduce high blood pressure (hypertension). It may also reduce your risk for type 2 diabetes, heart disease, and stroke. The DASH eating plan may also help with weight loss. What are tips for following this plan? General guidelines  Avoid eating more than 2,300 mg (milligrams) of salt (sodium) a day. If you have hypertension, you may need to reduce your sodium intake to 1,500 mg a day.  Limit alcohol intake to no more than 1 drink a day for nonpregnant women and 2 drinks a day for men. One drink equals 12 oz of beer, 5 oz of wine, or 1 oz of hard liquor.  Work with your health care provider to maintain a healthy body weight or to lose weight. Ask what an ideal weight is for you.  Get at least 30 minutes of exercise that causes your heart to beat faster (aerobic exercise) most days of the week. Activities may include walking, swimming, or biking.  Work with your health care provider or diet and nutrition specialist (dietitian) to adjust your eating plan to your individual calorie needs. Reading food labels  Check food labels for the amount of sodium per serving. Choose foods with less than 5 percent of the Daily Value of sodium. Generally, foods with less than 300 mg of sodium per serving fit into this eating plan.  To find whole grains, look for the word "whole" as the first word  in the ingredient list. Shopping  Buy products labeled as "low-sodium" or "no salt added."  Buy fresh foods. Avoid canned foods and premade or frozen meals. Cooking  Avoid adding salt when cooking. Use salt-free seasonings or herbs instead of table salt or sea salt. Check with your health care provider or pharmacist before using salt substitutes.  Do not fry foods. Cook foods using healthy methods such as baking, boiling, grilling, and broiling instead.  Cook with heart-healthy oils, such as olive, canola, soybean, or sunflower oil. Meal planning   Eat a balanced diet that includes: ? 5 or more servings of fruits and vegetables each day. At each meal, try to fill half of your plate with fruits and vegetables. ? Up to 6-8 servings of whole grains each day. ? Less than 6 oz of lean meat, poultry, or fish each day. A 3-oz serving of meat is about the same size as a deck of cards. One egg equals 1 oz. ? 2 servings of low-fat dairy each day. ? A serving of nuts, seeds, or beans 5 times each week. ? Heart-healthy fats. Healthy fats called Omega-3 fatty acids are found in foods such as flaxseeds and coldwater fish, like sardines, salmon, and mackerel.  Limit how much you eat of the following: ? Canned or prepackaged foods. ? Food that is high in trans fat, such as fried foods. ? Food  that is high in saturated fat, such as fatty meat. ? Sweets, desserts, sugary drinks, and other foods with added sugar. ? Full-fat dairy products.  Do not salt foods before eating.  Try to eat at least 2 vegetarian meals each week.  Eat more home-cooked food and less restaurant, buffet, and fast food.  When eating at a restaurant, ask that your food be prepared with less salt or no salt, if possible. What foods are recommended? The items listed may not be a complete list. Talk with your dietitian about what dietary choices are best for you. Grains Whole-grain or whole-wheat bread. Whole-grain or  whole-wheat pasta. Brown rice. [place]. Bulgur. Whole-grain and low-sodium cereals. Pita bread. Low-fat, low-sodium crackers. Whole-wheat flour tortillas. Vegetables Fresh or frozen vegetables (raw, steamed, roasted, or grilled). Low-sodium or reduced-sodium tomato and vegetable juice. Low-sodium or reduced-sodium tomato sauce and tomato paste. Low-sodium or reduced-sodium canned vegetables. Fruits All fresh, dried, or frozen fruit. Canned fruit in natural juice (without added sugar). Meat and other protein foods Skinless chicken or [place]. Ground chicken or [place]. Pork with fat trimmed off. Fish and seafood. Egg whites. Dried beans, peas, or lentils. Unsalted nuts, nut butters, and seeds. Unsalted canned beans. Lean cuts of beef with fat trimmed off. Low-sodium, lean deli meat. Dairy Low-fat (1%) or fat-free (skim) milk. Fat-free, low-fat, or reduced-fat cheeses. Nonfat, low-sodium ricotta or cottage cheese. Low-fat or nonfat yogurt. Low-fat, low-sodium cheese. Fats and oils Soft margarine without trans fats. Vegetable oil. Low-fat, reduced-fat, or light mayonnaise and salad dressings (reduced-sodium). Canola, safflower, olive, soybean, and sunflower oils. Avocado. Seasoning and other foods Herbs. Spices. Seasoning mixes without salt. Unsalted popcorn and pretzels. Fat-free sweets. What foods are not recommended? The items listed may not be a complete list. Talk with your dietitian about what dietary choices are best for you. Grains Baked goods made with fat, such as croissants, muffins, or some breads. Dry pasta or rice meal packs. Vegetables Creamed or fried vegetables. Vegetables in a cheese sauce. Regular canned vegetables (not low-sodium or reduced-sodium). Regular canned tomato sauce and paste (not low-sodium or reduced-sodium). Regular tomato and vegetable juice (not low-sodium or reduced-sodium). Angie Fava. Olives. Fruits Canned fruit in a light or heavy syrup. Fried fruit. Fruit  in cream or butter sauce. Meat and other protein foods Fatty cuts of meat. Ribs. Fried meat. Berniece Salines. Sausage. Bologna and other processed lunch meats. Salami. Fatback. Hotdogs. Bratwurst. Salted nuts and seeds. Canned beans with added salt. Canned or smoked fish. Whole eggs or egg yolks. Chicken or [place] with skin. Dairy Whole or 2% milk, cream, and half-and-half. Whole or full-fat cream cheese. Whole-fat or sweetened yogurt. Full-fat cheese. Nondairy creamers. Whipped toppings. Processed cheese and cheese spreads. Fats and oils Butter. Stick margarine. Lard. Shortening. Ghee. Bacon fat. Tropical oils, such as coconut, palm kernel, or palm oil. Seasoning and other foods Salted popcorn and pretzels. Onion salt, garlic salt, seasoned salt, table salt, and sea salt. Worcestershire sauce. Tartar sauce. Barbecue sauce. Teriyaki sauce. Soy sauce, including reduced-sodium. Steak sauce. Canned and packaged gravies. Fish sauce. Oyster sauce. Cocktail sauce. Horseradish that you find on the shelf. Ketchup. Mustard. Meat flavorings and tenderizers. Bouillon cubes. Hot sauce and Tabasco sauce. Premade or packaged marinades. Premade or packaged taco seasonings. Relishes. Regular salad dressings. Where to find more information:  National Heart, Lung, and [place]: https://wilson-eaton.com/  American Heart Association: www.heart.org Summary  The DASH eating plan is a healthy eating plan that has been shown to reduce high blood pressure (  hypertension). It may also reduce your risk for type 2 diabetes, heart disease, and stroke.  With the DASH eating plan, you should limit salt (sodium) intake to 2,300 mg a day. If you have hypertension, you may need to reduce your sodium intake to 1,500 mg a day.  When on the DASH eating plan, aim to eat more fresh fruits and vegetables, whole grains, lean proteins, low-fat dairy, and heart-healthy fats.  Work with your health care provider or diet and nutrition specialist  (dietitian) to adjust your eating plan to your individual calorie needs. This information is not intended to replace advice given to you by your health care provider. Make sure you discuss any questions you have with your health care provider. Document Released: 09/17/2011 Document Revised: 09/21/2016 Document Reviewed: 09/21/2016 Elsevier Interactive Patient Education  [employer].

## 2018-04-20 ENCOUNTER — Encounter: Payer: Self-pay | Admitting: Physician Assistant

## 2018-04-21 ENCOUNTER — Other Ambulatory Visit: Payer: Self-pay | Admitting: Physician Assistant

## 2018-04-21 DIAGNOSIS — I10 Essential (primary) hypertension: Secondary | ICD-10-CM

## 2018-04-21 MED ORDER — AMLODIPINE BESYLATE 10 MG PO TABS: 5.0000 mg | ORAL_TABLET | Freq: Every day | ORAL | 3 refills | Status: DC

## 2018-07-01 ENCOUNTER — Other Ambulatory Visit: Payer: Self-pay | Admitting: Physician Assistant

## 2018-07-01 DIAGNOSIS — I10 Essential (primary) hypertension: Secondary | ICD-10-CM

## 2018-08-17 ENCOUNTER — Telehealth: Payer: Self-pay

## 2018-08-17 NOTE — Telephone Encounter (Signed)
Pt left vm stating that his BP has been slowly creeping up.  He said that it was 160/80 today.  He is asking what should his step(s) be. Please advise. -EH/RMA

## 2018-08-17 NOTE — Telephone Encounter (Signed)
Schedule nurse visit Bring home BP machine and readings to appointment

## 2018-08-18 NOTE — Telephone Encounter (Signed)
Pt notified of recommendations.  He stated that he is now taking a whole (10 mg) tab of amlodipine instead of a half (5 mg) tab.  He said that this seems to be working for him.  He also said that if continues to have issues with his BP he will call to make appointment -EH/RMA

## 2018-08-23 DIAGNOSIS — H25813 Combined forms of age-related cataract, bilateral: Secondary | ICD-10-CM | POA: Diagnosis not present

## 2018-08-23 DIAGNOSIS — H1851 Endothelial corneal dystrophy: Secondary | ICD-10-CM | POA: Diagnosis not present

## 2018-08-23 DIAGNOSIS — H04123 Dry eye syndrome of bilateral lacrimal glands: Secondary | ICD-10-CM | POA: Diagnosis not present

## 2018-08-23 DIAGNOSIS — H401121 Primary open-angle glaucoma, left eye, mild stage: Secondary | ICD-10-CM | POA: Diagnosis not present

## 2018-08-23 DIAGNOSIS — H401112 Primary open-angle glaucoma, right eye, moderate stage: Secondary | ICD-10-CM | POA: Diagnosis not present

## 2018-08-27 ENCOUNTER — Other Ambulatory Visit: Payer: Self-pay | Admitting: Physician Assistant

## 2018-08-27 DIAGNOSIS — I10 Essential (primary) hypertension: Secondary | ICD-10-CM

## 2018-10-17 ENCOUNTER — Encounter: Payer: Self-pay | Admitting: Physician Assistant

## 2018-10-17 ENCOUNTER — Ambulatory Visit (INDEPENDENT_AMBULATORY_CARE_PROVIDER_SITE_OTHER): Payer: Medicare Other | Admitting: Physician Assistant

## 2018-10-17 VITALS — BP 158/84 | HR 68 | Wt 177.0 lb

## 2018-10-17 DIAGNOSIS — Z1211 Encounter for screening for malignant neoplasm of colon: Secondary | ICD-10-CM

## 2018-10-17 DIAGNOSIS — I10 Essential (primary) hypertension: Secondary | ICD-10-CM

## 2018-10-17 DIAGNOSIS — Z136 Encounter for screening for cardiovascular disorders: Secondary | ICD-10-CM

## 2018-10-17 MED ORDER — IRBESARTAN 75 MG PO TABS: 75.0000 mg | ORAL_TABLET | Freq: Every day | ORAL | 1 refills | Status: DC

## 2018-10-17 MED ORDER — AMLODIPINE BESYLATE 5 MG PO TABS: 5.0000 mg | ORAL_TABLET | Freq: Every day | ORAL | 1 refills | Status: DC

## 2018-10-17 NOTE — Progress Notes (Signed)
HPI:                                                                Cody Stephens is a 78 y.o. male who [facility] today for hypertension follow-up  HTN: Amlodipine reduced to 5 mg in July due to dizziness/lightheadedness. Compliant with medications. Checks BP's at home. BP range 119-153/71-91, HR 62-73. BP increased beginning November and are mostly 140 and above. He does hold his medication on days when morning BP is <120/80. Denies vision change, headache, chest pain with exertion, orthopnea, lightheadedness, syncope and edema. Risk factors include: 13age>55, male sex    [facility] 10/17/2018 02/28/2018 02/28/2018  Decreased Interest 0 0 0  Down, Depressed, Hopeless 0 0 0  PHQ - 2 Score 0 0 0    No flowsheet data found.    Past Medical History:  Diagnosis Date  . Glaucoma   . Hypertension    Past Surgical History:  Procedure Laterality Date  . DENTAL SURGERY  2016   implant   Social History   Tobacco Use  . Smoking status: Never Smoker  . Smokeless tobacco: Never Used  Substance Use Topics  . Alcohol use: Yes    Alcohol/week: 1.0 standard drinks    Types: 1 Standard drinks or equivalent per week   family history includes Hypertension in his mother.    ROS: negative except as noted in the HPI  Medications: Current Outpatient Medications  Medication Sig Dispense Refill  . amLODipine (NORVASC) 5 MG tablet Take 1 tablet (5 mg total) by mouth daily. 90 tablet 1  . latanoprost (XALATAN) 0.005 % ophthalmic solution 1 drop at bedtime.    . irbesartan (AVAPRO) 75 MG tablet Take 1 tablet (75 mg total) by mouth daily. 90 tablet 1   No current facility-administered medications for this visit.    No Known Allergies     Objective:  BP (!) 158/84   Pulse 68   Wt 177 lb (80.3 kg)   BMI 25.40 kg/m  Gen:  alert, not ill-appearing, no distress, appropriate for age HEENT: head normocephalic  without obvious abnormality, conjunctiva and cornea clear, wearing glasses, trachea midline Pulm: Normal work of breathing, normal phonation, clear to auscultation bilaterally, no wheezes, rales or rhonchi CV: Normal rate, regular rhythm, s1 and s2 distinct, grade II/VI systolic murmur heard best at the apex Neuro: alert and oriented x 3, no tremor MSK: extremities atraumatic, normal gait and station, trace peripheral edema Skin: intact, no rashes on exposed skin, no jaundice, no cyanosis Psych: well-groomed, cooperative, good eye contact, euthymic mood, affect mood-congruent, speech is articulate, and thought processes clear and goal-directed  Lab Results  Component Value Date   CREATININE 1.07 02/28/2018   BUN 16 02/28/2018   NA 141 02/28/2018   K 4.0 02/28/2018   CL 107 02/28/2018   CO2 29 02/28/2018   CrCl cannot be calculated (Patient's most recent lab result is older than the maximum 21 days allowed.).   No results found for this or any previous visit (from the past 72 hour(s)). No results found.    Assessment and Plan: 78 y.o. male with   .Renae Fickleaul was seen today for hypertension.  Diagnoses and all orders  for this visit:  Hypertension goal BP (blood pressure) < 140/90 -     amLODipine (NORVASC) 5 MG tablet; Take 1 tablet (5 mg total) by mouth daily. -     irbesartan (AVAPRO) 75 MG tablet; Take 1 tablet (75 mg total) by mouth daily. -     COMPLETE METABOLIC PANEL WITH GFR  Screening for AAA (abdominal aortic aneurysm) -     US ABDOMINAL AORTA SCREENING AAA; Future  Colon cancer screening Comments: FOBT given 10/17/18   HTN - BP out of range at home for approx 2 months and in office on 2 checks today - intolerant to full-dose Amlodipine - cont Amlodipine 5 mg and adding Irbesartan 75 mg - recheck renal function today  Reviewed health maintenance Declines colonoscopy, provided with FOBT Due for AAA screening Declines age-recommended vaccines  Patient education  and anticipatory guidance given Patient agrees with treatment plan Follow-up in 1 month for nurse bP check/ every 6 months for med mgmt or sooner as needed if symptoms worsen or fail to improve  Levonne Hubertharley E. Faraaz Wolin PA-C

## 2018-10-17 NOTE — Patient Instructions (Signed)
For your blood pressure: - Goal <130/80 (Ideally 120's/70's) - monitor and log blood pressures at home - check around the same time each day in a relaxed setting - Limit salt to <2500 mg/day - Follow DASH (Dietary Approach to Stopping Hypertension) eating plan - Try to get at least 150 minutes of aerobic exercise per week - Aim to go on a brisk walk 30 minutes per day at least 5 days per week. If you're not active, gradually increase how long you walk by 5 minutes each week - limit alcohol: 2 standard drinks per day for men and 1 per day for women - avoid tobacco/nicotine products. Consider smoking cessation if you smoke - weight loss: 7% of current body weight can reduce your blood pressure by 5-10 points - follow-up at least every 6 months for your blood pressure. Follow-up sooner if your BP is not controlled   Managing Your Hypertension Hypertension is commonly called high blood pressure. This is when the force of your blood pressing against the walls of your arteries is too strong. Arteries are blood vessels that carry blood from your heart throughout your body. Hypertension forces the heart to work harder to pump blood, and may cause the arteries to become narrow or stiff. Having untreated or uncontrolled hypertension can cause heart attack, stroke, kidney disease, and other problems. What are blood pressure readings? A blood pressure reading consists of a higher number over a lower number. Ideally, your blood pressure should be below 120/80. The first ("top") number is called the systolic pressure. It is a measure of the pressure in your arteries as your heart beats. The second ("bottom") number is called the diastolic pressure. It is a measure of the pressure in your arteries as the heart relaxes. What does my blood pressure reading mean? Blood pressure is classified into four stages. Based on your blood pressure reading, your health care provider may use the following stages to determine  what type of treatment you need, if any. Systolic pressure and diastolic pressure are measured in a unit called mm Hg. Normal  Systolic pressure: below 120.  Diastolic pressure: below 80. Elevated  Systolic pressure: 120-129.  Diastolic pressure: below 80. Hypertension stage 1  Systolic pressure: 130-139.  Diastolic pressure: 80-89. Hypertension stage 2  Systolic pressure: 140 or above.  Diastolic pressure: 90 or above. What health risks are associated with hypertension? Managing your hypertension is an important responsibility. Uncontrolled hypertension can lead to:  A heart attack.  A stroke.  A weakened blood vessel (aneurysm).  Heart failure.  Kidney damage.  Eye damage.  Metabolic syndrome.  Memory and concentration problems. What changes can I make to manage my hypertension? Hypertension can be managed by making lifestyle changes and possibly by taking medicines. Your health care provider will help you make a plan to bring your blood pressure within a normal range. Eating and drinking   Eat a diet that is high in fiber and potassium, and low in salt (sodium), added sugar, and fat. An example eating plan is called the DASH (Dietary Approaches to Stop Hypertension) diet. To eat this way: ? Eat plenty of fresh fruits and vegetables. Try to fill half of your plate at each meal with fruits and vegetables. ? Eat whole grains, such as whole wheat pasta, brown rice, or whole grain bread. Fill about one quarter of your plate with whole grains. ? Eat low-fat diary products. ? Avoid fatty cuts of meat, processed or cured meats, and poultry with skin.  Fill about one quarter of your plate with lean proteins such as fish, chicken without skin, beans, eggs, and tofu. ? Avoid premade and processed foods. These tend to be higher in sodium, added sugar, and fat.  Reduce your daily sodium intake. Most people with hypertension should eat less than 1,500 mg of sodium a  day.  Limit alcohol intake to no more than 1 drink a day for nonpregnant women and 2 drinks a day for men. One drink equals 12 oz of beer, 5 oz of wine, or 1 oz of hard liquor. Lifestyle  Work with your health care provider to maintain a healthy body weight, or to lose weight. Ask what an ideal weight is for you.  Get at least 30 minutes of exercise that causes your heart to beat faster (aerobic exercise) most days of the week. Activities may include walking, swimming, or biking.  Include exercise to strengthen your muscles (resistance exercise), such as weight lifting, as part of your weekly exercise routine. Try to do these types of exercises for 30 minutes at least 3 days a week.  Do not use any products that contain nicotine or tobacco, such as cigarettes and e-cigarettes. If you need help quitting, ask your health care provider.  Control any long-term (chronic) conditions you have, such as high cholesterol or diabetes. Monitoring  Monitor your blood pressure at home as told by your health care provider. Your personal target blood pressure may vary depending on your medical conditions, your age, and other factors.  Have your blood pressure checked regularly, as often as told by your health care provider. Working with your health care provider  Review all the medicines you take with your health care provider because there may be side effects or interactions.  Talk with your health care provider about your diet, exercise habits, and other lifestyle factors that may be contributing to hypertension.  Visit your health care provider regularly. Your health care provider can help you create and adjust your plan for managing hypertension. Will I need medicine to control my blood pressure? Your health care provider may prescribe medicine if lifestyle changes are not enough to get your blood pressure under control, and if:  Your systolic blood pressure is 130 or higher.  Your diastolic  blood pressure is 80 or higher. Take medicines only as told by your health care provider. Follow the directions carefully. Blood pressure medicines must be taken as prescribed. The medicine does not work as well when you skip doses. Skipping doses also puts you at risk for problems. Contact a health care provider if:  You think you are having a reaction to medicines you have taken.  You have repeated (recurrent) headaches.  You feel dizzy.  You have swelling in your ankles.  You have trouble with your vision. Get help right away if:  You develop a severe headache or confusion.  You have unusual weakness or numbness, or you feel faint.  You have severe pain in your chest or abdomen.  You vomit repeatedly.  You have trouble breathing. Summary  Hypertension is when the force of blood pumping through your arteries is too strong. If this condition is not controlled, it may put you at risk for serious complications.  Your personal target blood pressure may vary depending on your medical conditions, your age, and other factors. For most people, a normal blood pressure is less than 120/80.  Hypertension is managed by lifestyle changes, medicines, or both. Lifestyle changes include weight loss, eating  a healthy, low-sodium diet, exercising more, and limiting alcohol. This information is not intended to replace advice given to you by your health care provider. Make sure you discuss any questions you have with your health care provider. Document Released: 06/22/2012 Document Revised: 08/26/2016 Document Reviewed: 08/26/2016 Elsevier Interactive Patient Education  2019 [employer].

## 2018-10-19 ENCOUNTER — Other Ambulatory Visit: Payer: Self-pay | Admitting: Physician Assistant

## 2018-10-19 ENCOUNTER — Ambulatory Visit (HOSPITAL_BASED_OUTPATIENT_CLINIC_OR_DEPARTMENT_OTHER)
Admission: RE | Admit: 2018-10-19 | Discharge: 2018-10-19 | Disposition: A | Discharge status: Home / Self Care | Payer: Medicare Other | Source: Ambulatory Visit | Attending: Physician Assistant | Admitting: Physician Assistant

## 2018-10-19 ENCOUNTER — Encounter (HOSPITAL_BASED_OUTPATIENT_CLINIC_OR_DEPARTMENT_OTHER): Payer: Self-pay

## 2018-10-19 DIAGNOSIS — I150 Renovascular hypertension: Secondary | ICD-10-CM | POA: Diagnosis not present

## 2018-10-19 DIAGNOSIS — Z136 Encounter for screening for cardiovascular disorders: Secondary | ICD-10-CM

## 2018-11-02 ENCOUNTER — Encounter (HOSPITAL_BASED_OUTPATIENT_CLINIC_OR_DEPARTMENT_OTHER): Payer: Self-pay

## 2018-11-02 ENCOUNTER — Emergency Department (HOSPITAL_BASED_OUTPATIENT_CLINIC_OR_DEPARTMENT_OTHER)
Admission: EM | Admit: 2018-11-02 | Discharge: 2018-11-02 | Disposition: A | Discharge status: Home / Self Care | Payer: Medicare Other | Attending: Emergency Medicine | Admitting: Emergency Medicine

## 2018-11-02 ENCOUNTER — Other Ambulatory Visit: Payer: Self-pay

## 2018-11-02 DIAGNOSIS — I493 Ventricular premature depolarization: Secondary | ICD-10-CM

## 2018-11-02 DIAGNOSIS — R42 Dizziness and giddiness: Secondary | ICD-10-CM | POA: Diagnosis present

## 2018-11-02 DIAGNOSIS — R51 Headache: Secondary | ICD-10-CM | POA: Diagnosis not present

## 2018-11-02 DIAGNOSIS — I10 Essential (primary) hypertension: Secondary | ICD-10-CM | POA: Insufficient documentation

## 2018-11-02 DIAGNOSIS — Z79899 Other long term (current) drug therapy: Secondary | ICD-10-CM | POA: Insufficient documentation

## 2018-11-02 LAB — CBC WITH DIFFERENTIAL/PLATELET
Abs Immature Granulocytes: 0.01 10*3/uL (ref 0.00–0.07)
Basophils Absolute: 0 10*3/uL (ref 0.0–0.1)
Basophils Relative: 1 %
Eosinophils Absolute: 0.2 10*3/uL (ref 0.0–0.5)
Eosinophils Relative: 3 %
HEMATOCRIT: 50.8 % (ref 39.0–52.0)
Hemoglobin: 16.2 g/dL (ref 13.0–17.0)
IMMATURE GRANULOCYTES: 0 %
LYMPHS ABS: 2.2 10*3/uL (ref 0.7–4.0)
LYMPHS PCT: 34 %
MCH: 29.4 pg (ref 26.0–34.0)
MCHC: 31.9 g/dL (ref 30.0–36.0)
MCV: 92.2 fL (ref 80.0–100.0)
Monocytes Absolute: 0.6 10*3/uL (ref 0.1–1.0)
Monocytes Relative: 9 %
NEUTROS ABS: 3.4 10*3/uL (ref 1.7–7.7)
NRBC: 0 % (ref 0.0–0.2)
Neutrophils Relative %: 53 %
Platelets: 221 10*3/uL (ref 150–400)
RBC: 5.51 MIL/uL (ref 4.22–5.81)
RDW: 13 % (ref 11.5–15.5)
WBC: 6.3 10*3/uL (ref 4.0–10.5)

## 2018-11-02 LAB — TROPONIN I: Troponin I: <0.03 ng/mL (ref <0.03)

## 2018-11-02 LAB — COMPREHENSIVE METABOLIC PANEL
ALBUMIN: 4.3 g/dL (ref 3.5–5.0)
ALT: 11 U/L (ref 0–44)
AST: 20 U/L (ref 15–41)
Alkaline Phosphatase: 73 U/L (ref 38–126)
Anion gap: 8 (ref 5–15)
BILIRUBIN TOTAL: 0.7 mg/dL (ref 0.3–1.2)
BUN: 15 mg/dL (ref 8–23)
CO2: 29 mmol/L (ref 22–32)
Calcium: 9.1 mg/dL (ref 8.9–10.3)
Chloride: 102 mmol/L (ref 98–111)
Creatinine, Ser: 1.15 mg/dL (ref 0.61–1.24)
GFR calc Af Amer: >60 mL/min (ref >60)
GFR calc non Af Amer: >60 mL/min (ref >60)
GLUCOSE: 114 mg/dL — AB (ref 70–99)
POTASSIUM: 3.7 mmol/L (ref 3.5–5.1)
SODIUM: 139 mmol/L (ref 135–145)
TOTAL PROTEIN: 7.4 g/dL (ref 6.5–8.1)

## 2018-11-02 NOTE — ED Notes (Signed)
Pt got up and ambulated to restroom without assistance and denies any dizziness or lightheadedness

## 2018-11-02 NOTE — ED Provider Notes (Signed)
[facility] EMERGENCY DEPARTMENT Provider Note   CSN: [id number] Arrival date & time: 11/02/18  2012     History   Chief Complaint Chief Complaint  Patient presents with  . Dizziness    HPI Cody Stephens is a 78 y.o. male.  HPI   Reports was checking blood pressures as he typically does twice a day per his PCP, and as he was checking his blood pressures, noticed his heart was skipping a beat, then it happened again, and he checked with his hand and also felt it was skipping a beat. Did not feel palpitations.  Did not have any symptoms until he saw it skipped a beat. Then felt anxious and lightheaded.  10 days ago started taking other blood pressure medication.  No other changes in medication.  No chest pain, or shortness of breath.  No nausea/vomiting/diarrhea/.syncope or black or bloody stools  No hx of heart problems or irregular heart problems   Past Medical History:  Diagnosis Date  . Glaucoma   . Hypertension    on meds    Patient Active Problem List   Diagnosis Date Noted  . Hypertension goal BP (blood pressure) < 140/90 02/28/2018  . Wears hearing aid in both ears 02/28/2018  . Colon cancer screening declined 02/28/2018  . Refused pneumococcal vaccination 02/28/2018  . Glaucoma 02/28/2018    Past Surgical History:  Procedure Laterality Date  . DENTAL SURGERY  2016   implant        Home Medications    Prior to Admission medications   Medication Sig Start Date End Date Taking? Authorizing Provider  amLODipine (NORVASC) 5 MG tablet Take 1 tablet (5 mg total) by mouth daily. 10/17/18   Carlis Stable, PA-C  irbesartan (AVAPRO) 75 MG tablet Take 1 tablet (75 mg total) by mouth daily. 10/17/18   Carlis Stable, PA-C  latanoprost (XALATAN) 0.005 % ophthalmic solution 1 drop at bedtime.    [provider]    Family History Family History  Problem Relation Age of Onset  . Hypertension Mother     Social  History Social History   Tobacco Use  . Smoking status: Never Smoker  . Smokeless tobacco: Never Used  Substance Use Topics  . Alcohol use: Yes    Comment: occ  . Drug use: Never     Allergies   Patient has no known allergies.   Review of Systems Review of Systems  Constitutional: Negative for fever.  HENT: Negative for sore throat.   Eyes: Negative for visual disturbance.  Respiratory: Negative for shortness of breath.   Cardiovascular: Negative for chest pain.  Gastrointestinal: Negative for abdominal pain.  Genitourinary: Negative for difficulty urinating.  Musculoskeletal: Negative for back pain and neck stiffness.  Skin: Negative for rash.  Neurological: Positive for light-headedness. Negative for syncope and headaches.     Physical Exam Updated Vital Signs BP (!) 142/89 (BP Location: Right Arm)   Pulse 62   Temp 98.2 F (36.8 C) (Oral)   Resp 16   Ht 5\' 10"  (1.778 m)   Wt 80.3 kg   SpO2 97%   BMI 25.40 kg/m   Physical Exam Vitals signs and nursing note reviewed.  Constitutional:      General: He is not in acute distress.    Appearance: He is well-developed. He is not diaphoretic.  HENT:     Head: Normocephalic and atraumatic.  Eyes:     Conjunctiva/sclera: Conjunctivae normal.  Neck:  Musculoskeletal: Normal range of motion.  Cardiovascular:     Rate and Rhythm: Normal rate and regular rhythm.     Heart sounds: Normal heart sounds. No murmur. No friction rub. No gallop.   Pulmonary:     Effort: Pulmonary effort is normal. No respiratory distress.     Breath sounds: Normal breath sounds. No wheezing or rales.  Abdominal:     General: There is no distension.     Palpations: Abdomen is soft.     Tenderness: There is no abdominal tenderness. There is no guarding.  Skin:    General: Skin is warm and dry.  Neurological:     Mental Status: He is alert and oriented to person, place, and time.      ED Treatments / Results  Labs (all labs  ordered are listed, but only abnormal results are displayed) Labs Reviewed  COMPREHENSIVE METABOLIC PANEL - Abnormal; Notable for the following components:      Result Value   Glucose, Bld 114 (*)    All other components within normal limits  CBC WITH DIFFERENTIAL/PLATELET  TROPONIN I    EKG EKG Interpretation  Date/Time:  Wednesday November 02 2018 20:26:27 EST Ventricular Rate:  74 PR Interval:  154 QRS Duration: 86 QT Interval:  376 QTC Calculation: 417 R Axis:   61 Text Interpretation:  Sinus rhythm with sinus arrhythmia with occasional Premature ventricular complexes Nonspecific ST abnormality Abnormal ECG No previous ECGs available Confirmed by Alvira MondaySchlossman, Florence Yeung ([id number]) on 11/02/2018 8:31:24 PM   Radiology No results found.  Procedures Procedures (including critical care time)  Medications Ordered in ED Medications - No data to display   Initial Impression / Assessment and Plan / ED Course  I have reviewed the triage vital signs and the nursing notes.  Pertinent labs & imaging results that were available during my care of the patient were reviewed by me and considered in my medical decision making (see chart for details).     78yo male with history above presents with concern for heart skipping a beat, was asymptomatic prior to checking pulse, then felt lightheaded.  EKG show sinus arrhythmia and PVCs and telemetry with PVCs.  No sign of other arrhythmia. No anemia or electrolyte abnormalities. Troponin negative. No chest pain or dyspnea.  Suspect symptoms secondary to PVCs or possible sinus arrhythmia.  Patient reports blood pressures at home had been normal prior to starting ACEi-discussed recommendation to follow up with PCP< BP elevated here and has not had night medication.  Recommend follow up with Cardiology regarding sensation of irregular HR>  Patient discharged in stable condition with understanding of reasons to return.    Final Clinical Impressions(s) / ED  Diagnoses   Final diagnoses:  PVC (premature ventricular contraction)  Lightheadedness    ED Discharge Orders    None       Alvira MondaySchlossman, Stan Cantave, MD 11/03/18 [phone]

## 2018-11-02 NOTE — ED Notes (Signed)
Pt on monitor 

## 2018-11-02 NOTE — ED Notes (Signed)
Pt verbalizes understanding of d/c instructions and denies any further needs at this time. 

## 2018-11-02 NOTE — ED Triage Notes (Addendum)
Pt c/o feeling light headed-BP machine showed a missing beat-pt states he took his pulse and felt a "missing beat"-pt states PCP started him on irbesartan 75mg  on 10/17/18-NAD-to triage in w/c

## 2018-11-04 ENCOUNTER — Encounter: Payer: Self-pay | Admitting: Physician Assistant

## 2018-11-07 ENCOUNTER — Other Ambulatory Visit: Payer: Self-pay | Admitting: Physician Assistant

## 2018-11-07 DIAGNOSIS — I493 Ventricular premature depolarization: Secondary | ICD-10-CM

## 2018-11-14 ENCOUNTER — Ambulatory Visit (INDEPENDENT_AMBULATORY_CARE_PROVIDER_SITE_OTHER): Payer: Medicare Other | Admitting: Physician Assistant

## 2018-11-14 VITALS — BP 154/76 | HR 66 | Temp 98.4°F | Wt 177.0 lb

## 2018-11-14 DIAGNOSIS — I10 Essential (primary) hypertension: Secondary | ICD-10-CM

## 2018-11-14 NOTE — Progress Notes (Signed)
Patient in today for BP check. BP at last visit was 158/84. Patient reports that he is not taking the new medication (Irbesartan) that was started at last visit. Pt reports while he was taking ,he started feeling like his heart was skipping a beat.He went to hospital and they did EKG and said he was having PVC's.  Home readings are in range. Pt denies headaches, blurred vision, chest pain and shortness of breath.   Vitals:   11/14/18 1027  BP: (!) 154/76  Pulse: 66  Temp: 98.4 F (36.9 C)     Per provider patient is to continue with his amlodipine 5 mg and to keep scheduled appt with cardiologist..

## 2018-11-17 ENCOUNTER — Ambulatory Visit (INDEPENDENT_AMBULATORY_CARE_PROVIDER_SITE_OTHER): Payer: Medicare Other | Admitting: Cardiology

## 2018-11-17 ENCOUNTER — Encounter: Payer: Self-pay | Admitting: Cardiology

## 2018-11-17 VITALS — BP 140/76 | HR 72 | Ht 70.0 in | Wt 176.0 lb

## 2018-11-17 DIAGNOSIS — I10 Essential (primary) hypertension: Secondary | ICD-10-CM | POA: Diagnosis not present

## 2018-11-17 DIAGNOSIS — R0789 Other chest pain: Secondary | ICD-10-CM

## 2018-11-17 DIAGNOSIS — R002 Palpitations: Secondary | ICD-10-CM

## 2018-11-17 HISTORY — DX: Palpitations: R00.2

## 2018-11-17 NOTE — Patient Instructions (Signed)
Medication Instructions:  Your physician recommends that you continue on your current medications as directed. Please refer to the Current Medication list given to you today.  If you need a refill on your cardiac medications before your next appointment, please call your pharmacy.   Lab work: None  If you have labs (blood work) drawn today and your tests are completely normal, you will receive your results only by: Marland Kitchen MyChart Message (if you have MyChart) OR . A paper copy in the mail If you have any lab test that is abnormal or we need to change your treatment, we will call you to review the results.  Testing/Procedures: Your physician has requested that you have a stress echocardiogram. For further information please visit [email or web address] Please follow instruction sheet as given.  Your physician has recommended that you wear a holter monitor. Holter monitors are medical devices that record the heart's electrical activity. Doctors most often use these monitors to diagnose arrhythmias. Arrhythmias are problems with the speed or rhythm of the heartbeat. The monitor is a small, portable device. You can wear one while you do your normal daily activities. This is usually used to diagnose what is causing palpitations/syncope (passing out). Wear for 48 hours.   Follow-Up: At [facility], you and your health needs are our priority.  As part of our continuing mission to provide you with exceptional heart care, we have created designated Provider Care Teams.  These Care Teams include your primary Cardiologist (physician) and Advanced Practice Providers (APPs -  Physician Assistants and Nurse Practitioners) who all work together to provide you with the care you need, when you need it. You will need a follow up appointment in 6 months.  Please call our office 2 months in advance to schedule this appointment.      Exercise Stress Test An exercise stress test is a test to check how your heart  works during exercise. You will need to walk on a treadmill or ride an exercise bike for this test. An electrocardiogram (ECG) will record your heartbeat when you are at rest and when you are exercising. You may have an ultrasound or nuclear test after the exercise test. The test is done to check for coronary artery disease (CAD). It is also done to:  See how well you can exercise.  Watch for high blood pressure during exercise.  Test how well you can exercise after treatment.  Check the blood flow to your arms and legs. If your test result is not normal, more testing may be needed. What happens before the procedure?  Follow instructions from your doctor about what you cannot eat or drink. ? Do not have any drinks or foods that have caffeine in them for 24 hours before the test, or as told by your doctor. This includes coffee, tea (even decaf tea), sodas, chocolate, and cocoa.  Ask your doctor about changing or stopping your normal medicines. This is important if you: ? Take diabetes medicines. ? Take beta-blocker medicines. ? Wear a nitroglycerin patch.  If you use an inhaler, bring it with you to the test.  Do not put lotions, powders, creams, or oils on your chest before the test.  Wear comfortable shoes and clothing.  Do not use any products that have nicotine or tobacco in them, such as cigarettes and e-cigarettes. Stop using them at least 4 hours before the test. If you need help quitting, ask your doctor. What happens during the procedure?  Patches (electrodes) will be put  on your chest.  Wires will be connected to the patches. The wires will send signals to a machine to record your heartbeat.  Your heart rate will be watched while you are resting and while you are exercising. Your blood pressure will also be watched during the test.  You will walk on a treadmill or use a stationary bike. If you cannot use these, you may be asked to turn a crank with your hands.  The  activity will get harder and will raise your heart rate.  You may be asked to breathe into a tube a few times during the test. This measures the gases that you breathe out.  You will be asked how you are feeling throughout the test.  You will exercise until your heart reaches a target heart rate. You will stop early if: ? You feel dizzy. ? You have chest pain. ? You are out of breath. ? Your blood pressure is too high or too low. ? You have an irregular heartbeat. ? You have pain or aching in your arms or legs. The procedure may vary among doctors and hospitals. What happens after the procedure?  Your blood pressure, heart rate, breathing rate, and blood oxygen level will be watched after the test.  You may return to your normal diet and activities as told by your doctor.  It is up to you to get the results of your test. Ask your doctor, or the department that is doing the test, when your results will be ready. Summary  An exercise stress test is a test to check how your heart works during exercise.  This test is done to check for coronary artery disease.  Your heart rate will be watched while you are resting and while you are exercising.  Follow instructions from your doctor about what you cannot eat or drink before the test. This information is not intended to replace advice given to you by your health care provider. Make sure you discuss any questions you have with your health care provider. Document Released: 03/16/2008 Document Revised: 12/29/2016 Document Reviewed: 12/29/2016 Elsevier Interactive Patient Education  2019 Elsevier Inc.    Ambulatory Cardiac Monitoring An ambulatory cardiac monitor is a small recording device that is used to detect abnormal heart rhythms (arrhythmias). Most monitors are connected by wires to flat, sticky disks (electrodes) that are then attached to your chest. You may need to wear a monitor if you have had symptoms such as:  Fast heartbeats  (palpitations).  Dizziness.  Fainting or light-headedness.  Unexplained weakness.  Shortness of breath. There are several types of monitors. Some common monitors include:  Holter monitor. This records your heart rhythm continuously, usually for 24-48 hours.  Event (episodic) monitor. This monitor has a symptoms button, and when pushed, it will begin recording. You need to activate this monitor to record when you have a heart-related symptom.  Automatic detection monitor. This monitor will begin recording when it detects an abnormal heartbeat. What are the risks? Generally, these devices are safe to use. However, it is possible that the skin under the electrodes will become irritated. How to prepare for monitoring Your health care provider will prepare your chest for the electrode placement and show you how to use the monitor.  Do not apply lotions to your chest before monitoring.  Follow directions on how to care for the monitor, and how to return the monitor when the testing period is complete. How to use your cardiac monitor  Follow directions  about how long to wear the monitor, and if you can take the monitor off in order to shower or bathe. ? Do not let the monitor get wet. ? Do not bathe, swim, or use a hot tub while wearing the monitor.  Keep your skin clean. Do not put body lotion or moisturizer on your chest.  Change the electrodes as told by your health care provider, or any time they stop sticking to your skin. You may need to use medical tape to keep them on.  Try to put the electrodes in slightly different places on your chest to help prevent skin irritation. Follow directions from your health care provider about where to place the electrodes.  Make sure the monitor is safely clipped to your clothing or in a location close to your body as recommended by your health care provider.  If your monitor has a symptoms button, press the button to mark an event as soon as you  feel a heart-related symptom, such as: ? Dizziness. ? Weakness. ? Light-headedness. ? Palpitations. ? Thumping or pounding in your chest. ? Shortness of breath. ? Unexplained weakness.  Keep a diary of your activities, such as walking, doing chores, and taking medicine. It is very important to note what you were doing when you pushed the button to record your symptoms. This will help your health care provider determine what might be contributing to your symptoms.  Send the recorded information as recommended by your health care provider. It may take some time for your health care provider to process the results.  Change the batteries as told by your health care provider.  Keep electronic devices away from your monitor. These include: ? Tablets. ? MP3 players. ? Cell phones.  While wearing your monitor you should avoid: ? Electric blankets. ? Firefighter. ? Electric toothbrushes. ? Microwave ovens. ? Magnets. ? Metal detectors. Get help right away if:  You have chest pain.  You have shortness of breath or extreme difficulty breathing.  You develop a very fast heartbeat that does not get better.  You develop dizziness that does not go away.  You faint or constantly feel like you are about to faint. Summary  An ambulatory cardiac monitor is a small recording device that is used to detect abnormal heart rhythms (arrhythmias).  Make sure you understand how to send the information from the monitor to your health care provider.  It is important to press the button on the monitor when you have any heart-related symptoms.  Keep a diary of your activities, such as walking, doing chores, and taking medicine. It is very important to note what you were doing when you pushed the button to record your symptoms. This will help your health care provider learn what might be causing your symptoms. This information is not intended to replace advice given to you by your health care  provider. Make sure you discuss any questions you have with your health care provider. Document Released: 07/07/2008 Document Revised: 07/14/2017 Document Reviewed: 09/12/2016 Elsevier Interactive Patient Education  2019 [employer].

## 2018-11-17 NOTE — Progress Notes (Signed)
Cardiology Office Note:    Date:  11/17/2018   ID:  CALAB KATZENMEYER, DOB 1941-06-05, MRN [id number]  PCP:  Carlis Stable, PA-C  Cardiologist:  Garwin Brothers, MD   Referring MD: Donzetta Kohut*    ASSESSMENT:    1. Palpitations   2. Hypertension goal BP (blood pressure) < 140/90   3. Chest discomfort    PLAN:    In order of problems listed above:  1. Primary prevention stressed with the patient.  Importance of compliance with diet and medication stressed and he vocalized understanding.  His blood pressure is stable.  Diet was discussed for essential hypertension.  He has mild dyslipidemia. 2. Patient will undergo 48 Holter monitoring to assess his palpitations.  TSH done a few months ago was fine.  I also will have him undergo exercise stress echo to assess his symptoms. 3. Patient will be seen in follow-up appointment in 6 months or earlier if the patient has any concerns    Medication Adjustments/Labs and Tests Ordered: Current medicines are reviewed at length with the patient today.  Concerns regarding medicines are outlined above.  Orders Placed This Encounter  Procedures  . Holter monitor - 48 hour  . ECHOCARDIOGRAM STRESS TEST   No orders of the defined types were placed in this encounter.    History of Present Illness:    Cody Stephens is a 78 y.o. male who is being seen today for the evaluation of palpitations and chest discomfort at the request of Donzetta Kohut*.  Patient is a pleasant 78 year old male.  He has past medical history of essential hypertension.  He mentions to me that he had a skipped beat-like sensation when he took a ACE inhibitor.  Subsequently this has been fine.  EKG has revealed PVCs in the past and therefore he is here for evaluation.  He complains of chest discomfort at times and this is not related to exertion.  No orthopnea or PND.  No severe dizzy spells or syncope.  Time  Past Medical History:  Diagnosis Date   . Glaucoma   . Hypertension    on meds    Past Surgical History:  Procedure Laterality Date  . DENTAL SURGERY  2016   implant    Current Medications: Current Meds  Medication Sig  . amLODipine (NORVASC) 5 MG tablet Take 1 tablet (5 mg total) by mouth daily.  Marland Kitchen latanoprost (XALATAN) 0.005 % ophthalmic solution 1 drop at bedtime.     Allergies:   Patient has no known allergies.   Social History   Socioeconomic History  . Marital status: Married    Spouse name: Bosie Clos  . Number of children: Not on file  . Years of education: Not on file  . Highest education level: Doctorate  Occupational History  . Occupation: Retired Physicist, medical  . Financial resource strain: Not on file  . Food insecurity:    Worry: Not on file    Inability: Not on file  . Transportation needs:    Medical: Not on file    Non-medical: Not on file  Tobacco Use  . Smoking status: Never Smoker  . Smokeless tobacco: Never Used  Substance and Sexual Activity  . Alcohol use: Yes    Comment: occ  . Drug use: Never  . Sexual activity: Not on file  Lifestyle  . Physical activity:    Days per week: Not on file    Minutes per session: Not on file  .  Stress: Not on file  Relationships  . Social connections:    Talks on phone: Not on file    Gets together: Not on file    Attends religious service: Not on file    Active member of club or organization: Not on file    Attends meetings of clubs or organizations: Not on file    Relationship status: Not on file  Other Topics Concern  . Not on file  Social History Narrative  . Not on file     Family History: The patient's family history includes Hypertension in his mother.  ROS:   Please see the history of present illness.    All other systems reviewed and are negative.  EKGs/Labs/Other Studies Reviewed:    The following studies were reviewed today: I discussed my findings with the patient at extensive length.   Recent  Labs: 11/02/2018: ALT 11; BUN 15; Creatinine, Ser 1.15; Hemoglobin 16.2; Platelets 221; Potassium 3.7; Sodium 139  Recent Lipid Panel    Component Value Date/Time   CHOL 191 02/28/2018 1618   TRIG 91 02/28/2018 1618   HDL 51 02/28/2018 1618   CHOLHDL 3.7 02/28/2018 1618   LDLCALC 121 (H) 02/28/2018 1618    Physical Exam:    VS:  BP 140/76 (BP Location: Right Arm, Patient Position: Sitting, Cuff Size: Normal)   Pulse 72   Ht 5\' 10"  (1.778 m)   Wt 176 lb (79.8 kg)   SpO2 96%   BMI 25.25 kg/m     Wt Readings from Last 3 Encounters:  11/17/18 176 lb (79.8 kg)  11/14/18 177 lb (80.3 kg)  11/02/18 177 lb (80.3 kg)     GEN: Patient is in no acute distress HEENT: Normal NECK: No JVD; No carotid bruits LYMPHATICS: No lymphadenopathy CARDIAC: S1 S2 regular, 2/6 systolic murmur at the apex. RESPIRATORY:  Clear to auscultation without rales, wheezing or rhonchi  ABDOMEN: Soft, non-tender, non-distended MUSCULOSKELETAL:  No edema; No deformity  SKIN: Warm and dry NEUROLOGIC:  Alert and oriented x 3 PSYCHIATRIC:  Normal affect    Signed, Garwin Brothers, MD  11/17/2018 2:16 PM    [facility]

## 2018-11-18 ENCOUNTER — Encounter: Payer: Self-pay | Admitting: Physician Assistant

## 2018-11-21 ENCOUNTER — Ambulatory Visit (INDEPENDENT_AMBULATORY_CARE_PROVIDER_SITE_OTHER): Payer: Medicare Other

## 2018-11-21 DIAGNOSIS — R002 Palpitations: Secondary | ICD-10-CM

## 2018-11-24 ENCOUNTER — Ambulatory Visit (INDEPENDENT_AMBULATORY_CARE_PROVIDER_SITE_OTHER): Payer: Medicare Other | Admitting: Physician Assistant

## 2018-11-24 ENCOUNTER — Encounter: Payer: Self-pay | Admitting: Physician Assistant

## 2018-11-24 VITALS — BP 138/73 | HR 77 | Wt 178.0 lb

## 2018-11-24 DIAGNOSIS — R0981 Nasal congestion: Secondary | ICD-10-CM

## 2018-11-24 DIAGNOSIS — Z9189 Other specified personal risk factors, not elsewhere classified: Secondary | ICD-10-CM | POA: Diagnosis not present

## 2018-11-24 HISTORY — DX: Nasal congestion: R09.81

## 2018-11-24 MED ORDER — FLUTICASONE PROPIONATE 50 MCG/ACT NA SUSP: 1.0000 | Freq: Every day | NASAL | 6 refills | Status: DC

## 2018-11-24 NOTE — Progress Notes (Signed)
HPI:                                                                Cody Stephens is a 78 y.o. male who presents to [facility] today for "concerned I have sleep apnea"  For several years he has had infrequent, intermittent episodes of waking up needing to take a single deep breath. This occurs about once every several weeks or months. Denies PND and orthopnea. He denies excessive daytime sleepiness. Wife states when he lays on his left side she notices snoring-type sounds and occasional pauses in his breathing. Otherwise he does not snore habitually and tends to be a quiet sleeper. He has had increased nasal congestion lately, and he has noticed because he is not a mouth breather and has been waking up with a dry mouth. Does not drink alcohol. No OTC sleep aids.   Results of the Epworth flowsheet 11/24/2018  Sitting and reading 0  Watching TV 2  Sitting, inactive in a public place (e.g. a theatre or a meeting) 0  As a passenger in a car for an hour without a break 0  Lying down to rest in the afternoon when circumstances permit 0  Sitting and talking to someone 0  Sitting quietly after a lunch without alcohol 0  In a car, while stopped for a few minutes in traffic 0  Total score 2    Depression screen [facility] 2/9 11/24/2018 10/17/2018 02/28/2018 02/28/2018  Decreased Interest 0 0 0 0  Down, Depressed, Hopeless 0 0 0 0  PHQ - 2 Score 0 0 0 0    No flowsheet data found.    Past Medical History:  Diagnosis Date  . Glaucoma   . Hypertension    on meds   Past Surgical History:  Procedure Laterality Date  . DENTAL SURGERY  2016   implant   Social History   Tobacco Use  . Smoking status: Never Smoker  . Smokeless tobacco: Never Used  Substance Use Topics  . Alcohol use: Yes    Comment: occ   family history includes Hypertension in his mother.    ROS: negative except as noted in the HPI  Medications: Current Outpatient  Medications  Medication Sig Dispense Refill  . amLODipine (NORVASC) 5 MG tablet Take 1 tablet (5 mg total) by mouth daily. 90 tablet 1  . latanoprost (XALATAN) 0.005 % ophthalmic solution 1 drop at bedtime.     No current facility-administered medications for this visit.    No Known Allergies     Objective:  BP 138/73   Pulse 77   Wt 178 lb (80.7 kg)   SpO2 95%   BMI 25.54 kg/m  Gen:  alert, not ill-appearing, no distress, appropriate for age HEENT: head normocephalic without obvious abnormality, conjunctiva and cornea clear, wearing glasses, bilateral hearing aids, nasal mucosa pink, oropharynx clear, tonsils grade 1+, uvula midline, uvula is elongated and touches the posterior tongue, no cervical adenopathy, trachea midline Pulm: Normal work of breathing, normal phonation   No results found for this or any previous visit (from the past 72 hour(s)). No results found.    Assessment and Plan: 78 y.o. male with   .Diagnoses and all orders for this  visit:  At risk for obstructive sleep apnea -     Cancel: Home sleep test; Future -     Home sleep test; Future  Nasal congestion -     fluticasone (FLONASE) 50 MCG/ACT nasal spray; Place 1 spray into both nostrils daily.     ESS= 2 Mildly positive STOPBANG: + male sex, + HTN  Recommend treating nasal symptoms with Flonase, breathe right strip and nasal saline  Elongated uvula may be contributory. Await sleep study results and consider referral to ENT   Patient education and anticipatory guidance given Patient agrees with treatment plan Follow-up as needed if symptoms worsen or fail to improve  Levonne Hubertharley E. Zennie Ayars PA-C

## 2018-11-24 NOTE — Patient Instructions (Signed)
Sleep Studies  A sleep study (polysomnogram) is a series of tests done while you are sleeping. A sleep study records your brain waves, heart rate, breathing rate, oxygen level, and eye and leg movements.  A sleep study helps your health care provider:  · See how well you sleep.  · Diagnose a sleep disorder.  · Determine how severe your sleep disorder is.  · Create a plan to treat your sleep disorder.  Your health care provider may recommend a sleep study if you:  · Feel sleepy on most days.  · Snore loudly while sleeping.  · Have unusual behaviors while you sleep, such as walking.  · Have brief periods in which you stop breathing during sleep (sleepapnea).  · Fall asleep suddenly during the day (narcolepsy).  · Have trouble falling asleep or staying asleep (insomnia).  · Feel like you need to move your legs when trying to fall asleep (restless legs syndrome).  · Move your legs by flexing and extending them regularly while asleep (periodic limb movement disorder).  · Act out your dreams while you sleep (sleep behavior disorder).  · Feel like you cannot move when you first wake up (sleep paralysis).  What tests are part of a sleep study?  Most sleep studies record the following during sleep:  · Brain activity.  · Eye movements.  · Heart rate and rhythm.  · Breathing rate and rhythm.  · Blood-oxygen level.  · Blood pressure.  · Chest and belly movement as you breathe.  · Arm and leg movements.  · Snoring or other noises.  · Body position.  Where are sleep studies done?  Sleep studies are done at sleep centers. A sleep center may be inside a hospital, office, or clinic.  The room where you have the study may look like a hospital room or a hotel room. The health care providers doing the study may come in and out of the room during the study. Most of the time, they will be in another room monitoring your test as you sleep.  How are sleep studies done?  Most sleep studies are done during a normal period of time for a full  night of sleep. You will arrive at the study center in the evening and go home in the morning.  Before the test  · Bring your pajamas and toothbrush with you to the sleep study.  · Do not have caffeine on the day of your sleep study.  · Do not drink alcohol on the day of your sleep study.  · Your health care provider will let you know if you should stop taking any of your regular medicines before the test.  During the test         · Round, sticky patches with sensors attached to recording wires (electrodes) are placed on your scalp, face, chest, and limbs.  · Wires from all the electrodes and sensors run from your bed to a computer. The wires can be taken off and put back on if you need to get out of bed to go to the bathroom.  · A sensor is placed over your nose to measure airflow.  · A finger clip is put on your finger or ear to measure your blood oxygen level (pulse oximetry).  · A belt is placed around your belly and a belt is placed around your chest to measure breathing movements.  · If you have signs of the sleep disorder called sleep apnea during your test,   you may get a treatment mask to wear for the second half of the night.  ? The mask provides positive airway pressure (PAP) to help you breathe better during sleep. This may greatly improve your sleep apnea.  ? You will then have all tests done again with the mask in place to see if your measurements and recordings change.  After the test  · A medical doctor who specializes in sleep will evaluate the results of your sleep study and share them with you and your primary health care provider.  · Based on your results, your medical history, and a physical exam, you may be diagnosed with a sleep disorder, such as:  ? Sleep apnea.  ? Restless legs syndrome.  ? Sleep-related behavior disorder.  ? Sleep-related movement disorders.  ? Sleep-related seizure disorders.  · Your health care team will help determine your treatment options based on your diagnosis. This  may include:  ? Improving your sleep habits (sleep hygiene).  ? Wearing a continuous positive airway pressure (CPAP) or bi-level positive airway pressure (BPAP) mask.  ? Wearing an oral device at night to improve breathing and reduce snoring.  ? Taking medicines.  Follow these instructions at home:  · Take over-the-counter and prescription medicines only as told by your health care provider.  · If you are instructed to use a CPAP or BPAP mask, make sure you use it nightly as directed.  · Make any lifestyle changes that your health care provider recommends.  · If you were given a device to open your airway while you sleep, use it only as told by your health care provider.  · Do not use any tobacco products, such as cigarettes, chewing tobacco, and e-cigarettes. If you need help quitting, ask your health care provider.  · Keep all follow-up visits as told by your health care provider. This is important.  Summary  · A sleep study (polysomnogram) is a series of tests done while you are sleeping. It shows how well you sleep.  · Most sleep studies are done over one full night of sleep. You will arrive at the study center in the evening and go home in the morning.  · If you have signs of the sleep disorder called sleep apnea during your test, you may get a treatment mask to wear for the second half of the night.  · A medical doctor who specializes in sleep will evaluate the results of your sleep study and share them with your primary health care provider.  This information is not intended to replace advice given to you by your health care provider. Make sure you discuss any questions you have with your health care provider.  Document Released: 04/04/2003 Document Revised: 10/26/2017 Document Reviewed: 10/26/2017  Elsevier Interactive Patient Education © 2019 Elsevier Inc.

## 2018-11-25 ENCOUNTER — Ambulatory Visit (HOSPITAL_BASED_OUTPATIENT_CLINIC_OR_DEPARTMENT_OTHER)
Admission: RE | Admit: 2018-11-25 | Discharge: 2018-11-25 | Disposition: A | Discharge status: Home / Self Care | Payer: Medicare Other | Source: Ambulatory Visit | Attending: Cardiology | Admitting: Cardiology

## 2018-11-25 DIAGNOSIS — R002 Palpitations: Secondary | ICD-10-CM | POA: Insufficient documentation

## 2018-11-25 DIAGNOSIS — R0789 Other chest pain: Secondary | ICD-10-CM | POA: Insufficient documentation

## 2018-11-25 NOTE — Progress Notes (Signed)
  Echocardiogram Echocardiogram Stress Test has been performed.  Cody Stephens T [name] 11/25/2018, 3:13 PM

## 2018-11-28 ENCOUNTER — Telehealth: Payer: Self-pay

## 2018-11-28 NOTE — Telephone Encounter (Signed)
Called patient cell left message to call back for holter monitor results and questions. Copy of results sent to PCP as requested by Dr. Tomie China.

## 2018-11-28 NOTE — Telephone Encounter (Signed)
-----   Message from Rajan R Revankar, MD sent at 11/28/2018 12:16 PM EST ----- The results of the study is unremarkable. Please inform patient. I will discuss in detail at next appointment. Cc  primary care/referring physician Rajan R Revankar, MD 11/28/2018 12:16 PM 

## 2018-12-01 ENCOUNTER — Telehealth: Payer: Self-pay

## 2018-12-01 NOTE — Telephone Encounter (Signed)
Left message on home phone, wife's cell to return call back to office for results.

## 2018-12-01 NOTE — Telephone Encounter (Signed)
-----   Message from Rajan R Revankar, MD sent at 11/28/2018 12:16 PM EST ----- The results of the study is unremarkable. Please inform patient. I will discuss in detail at next appointment. Cc  primary care/referring physician Rajan R Revankar, MD 11/28/2018 12:16 PM 

## 2018-12-01 NOTE — Telephone Encounter (Signed)
-----   Message from Garwin Brothers, MD sent at 11/28/2018 12:16 PM EST ----- The results of the study is unremarkable. Please inform patient. I will discuss in detail at next appointment. Cc  primary care/referring physician Garwin Brothers, MD 11/28/2018 12:16 PM

## 2018-12-01 NOTE — Telephone Encounter (Signed)
RN spoke with patient and relayed results.Patient in agreement. No further questions at this moment. Copy of results sent to Dr.C. Eddie Candle, per Dr.RRR request

## 2018-12-14 ENCOUNTER — Ambulatory Visit (INDEPENDENT_AMBULATORY_CARE_PROVIDER_SITE_OTHER): Payer: Medicare Other | Admitting: Cardiology

## 2018-12-14 ENCOUNTER — Encounter: Payer: Self-pay | Admitting: Cardiology

## 2018-12-14 VITALS — BP 132/70 | HR 61 | Ht 70.0 in | Wt 178.0 lb

## 2018-12-14 DIAGNOSIS — R002 Palpitations: Secondary | ICD-10-CM | POA: Diagnosis not present

## 2018-12-14 NOTE — Patient Instructions (Signed)
Medication Instructions:   Your physician recommends that you continue on your current medications as directed. Please refer to the Current Medication list given to you today.   If you need a refill on your cardiac medications before your next appointment, please call your pharmacy.   Lab work:  NONE  Testing/Procedures:  NONE  Follow-Up: At [facility], you and your health needs are our priority.  As part of our continuing mission to provide you with exceptional heart care, we have created designated Provider Care Teams.  These Care Teams include your primary Cardiologist (physician) and Advanced Practice Providers (APPs -  Physician Assistants and Nurse Practitioners) who all work together to provide you with the care you need, when you need it.   You will need a follow up appointment in 6 months.  Please call our office 2 months in advance to schedule this appointment.     

## 2018-12-14 NOTE — Progress Notes (Signed)
Cardiology Office Note:    Date:  12/14/2018   ID:  Cody Stephens, DOB 07-19-1941, MRN [id number]  PCP:  Carlis Stable, PA-C  Cardiologist:  Garwin Brothers, MD   Referring MD: Donzetta Kohut*    ASSESSMENT:    1. Palpitations    PLAN:    In order of problems listed above:  1. Primary prevention stressed with the patient.  Importance of compliance with diet and medication stressed and he vocalized understanding.  His blood pressure is stable.  Diet was discussed for dyslipidemia. 2. I discussed stress echo and Holter monitor reports and they are both unremarkable.  He has good effort tolerance and both these tests he did well on them and is happy to know about it.  He has excellent exercise with square dancing. 3. Patient will be seen in follow-up appointment in 6 months or earlier if the patient has any concerns    Medication Adjustments/Labs and Tests Ordered: Current medicines are reviewed at length with the patient today.  Concerns regarding medicines are outlined above.  No orders of the defined types were placed in this encounter.  No orders of the defined types were placed in this encounter.    No chief complaint on file.    History of Present Illness:    Cody Stephens is a 78 y.o. male.  Patient was evaluated for palpitations and some shortness of breath issues.  He has history of hypertension.  He denies any problems at this time and takes care of activities of daily living.  He and his wife do a lot of square at sink without any problems.  At the time of my evaluation, the patient is alert awake oriented and in no distress.  Past Medical History:  Diagnosis Date  . Glaucoma   . Hypertension    on meds    Past Surgical History:  Procedure Laterality Date  . DENTAL SURGERY  2016   implant    Current Medications: Current Meds  Medication Sig  . amLODipine (NORVASC) 5 MG tablet Take 1 tablet (5 mg total) by mouth daily.  Marland Kitchen  latanoprost (XALATAN) 0.005 % ophthalmic solution 1 drop at bedtime.     Allergies:   Patient has no known allergies.   Social History   Socioeconomic History  . Marital status: Married    Spouse name: Cody Stephens  . Number of children: Not on file  . Years of education: Not on file  . Highest education level: Doctorate  Occupational History  . Occupation: Retired Physicist, medical  . Financial resource strain: Not on file  . Food insecurity:    Worry: Not on file    Inability: Not on file  . Transportation needs:    Medical: Not on file    Non-medical: Not on file  Tobacco Use  . Smoking status: Never Smoker  . Smokeless tobacco: Never Used  Substance and Sexual Activity  . Alcohol use: Yes    Comment: occ  . Drug use: Never  . Sexual activity: Not on file  Lifestyle  . Physical activity:    Days per week: Not on file    Minutes per session: Not on file  . Stress: Not on file  Relationships  . Social connections:    Talks on phone: Not on file    Gets together: Not on file    Attends religious service: Not on file    Active member of club or organization: Not on  file    Attends meetings of clubs or organizations: Not on file    Relationship status: Not on file  Other Topics Concern  . Not on file  Social History Narrative  . Not on file     Family History: The patient's family history includes Hypertension in his mother.  ROS:   Please see the history of present illness.    All other systems reviewed and are negative.  EKGs/Labs/Other Studies Reviewed:    The following studies were reviewed today: I discussed my findings with the patient at extensive length.   Recent Labs: 11/02/2018: ALT 11; BUN 15; Creatinine, Ser 1.15; Hemoglobin 16.2; Platelets 221; Potassium 3.7; Sodium 139  Recent Lipid Panel    Component Value Date/Time   CHOL 191 02/28/2018 1618   TRIG 91 02/28/2018 1618   HDL 51 02/28/2018 1618   CHOLHDL 3.7 02/28/2018 1618   LDLCALC 121  (H) 02/28/2018 1618    Physical Exam:    VS:  BP 132/70 (BP Location: Right Arm, Patient Position: Sitting, Cuff Size: Normal)   Pulse 61   Ht 5\' 10"  (1.778 m)   Wt 178 lb (80.7 kg)   SpO2 98%   BMI 25.54 kg/m     Wt Readings from Last 3 Encounters:  12/14/18 178 lb (80.7 kg)  11/24/18 178 lb (80.7 kg)  11/17/18 176 lb (79.8 kg)     GEN: Patient is in no acute distress HEENT: Normal NECK: No JVD; No carotid bruits LYMPHATICS: No lymphadenopathy CARDIAC: Hear sounds regular, 2/6 systolic murmur at the apex. RESPIRATORY:  Clear to auscultation without rales, wheezing or rhonchi  ABDOMEN: Soft, non-tender, non-distended MUSCULOSKELETAL:  No edema; No deformity  SKIN: Warm and dry NEUROLOGIC:  Alert and oriented x 3 PSYCHIATRIC:  Normal affect   Signed, Garwin Brothers, MD  12/14/2018 10:29 AM    [facility]

## 2019-01-13 IMAGING — US US AORTA
1 series · 14 of 25 positions shown · non-contrast
Comparison: None.

CLINICAL DATA: Hypertension.

EXAM:
ULTRASOUND OF ABDOMINAL AORTA
TECHNIQUE: Ultrasound examination of the abdominal aorta was performed to
evaluate for abdominal aortic aneurysm.

[Series 1: us aorta · 0.29mm/px · 14 of 33 slices shown]
[im 1/33]
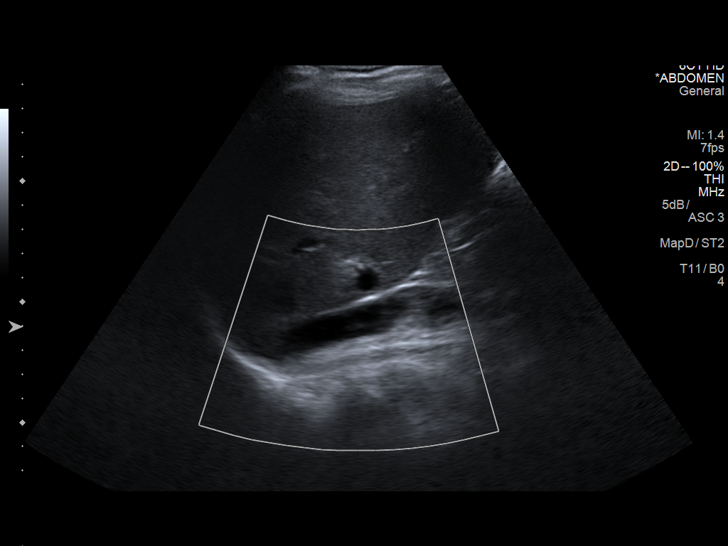
[im 3/33]
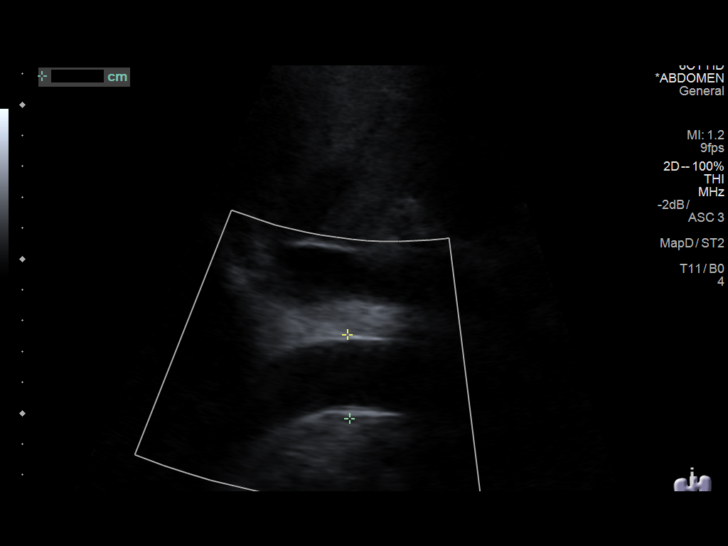
[im 6/33]
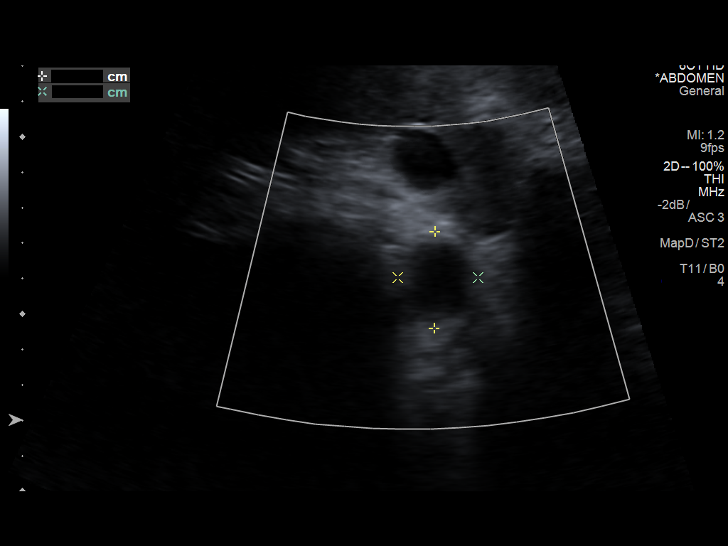
[im 9/33]
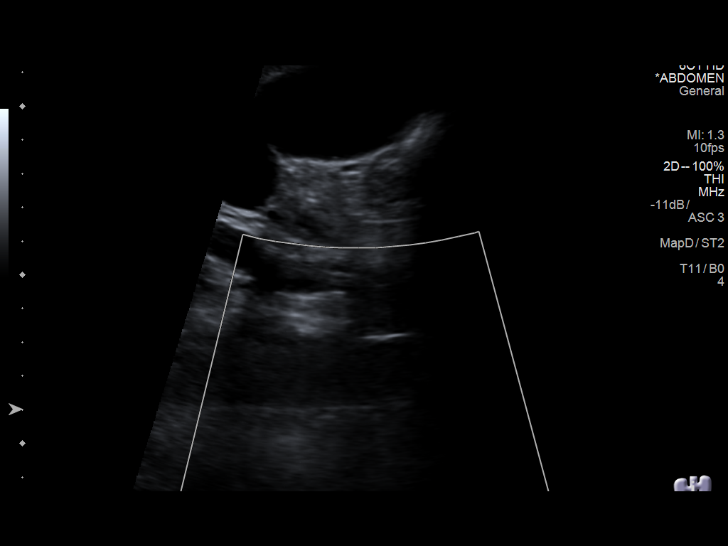
[im 11/33]
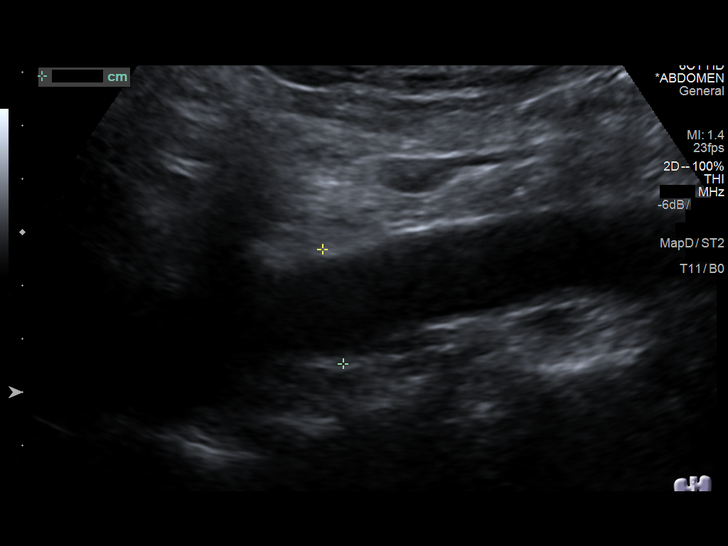
[im 13/33]
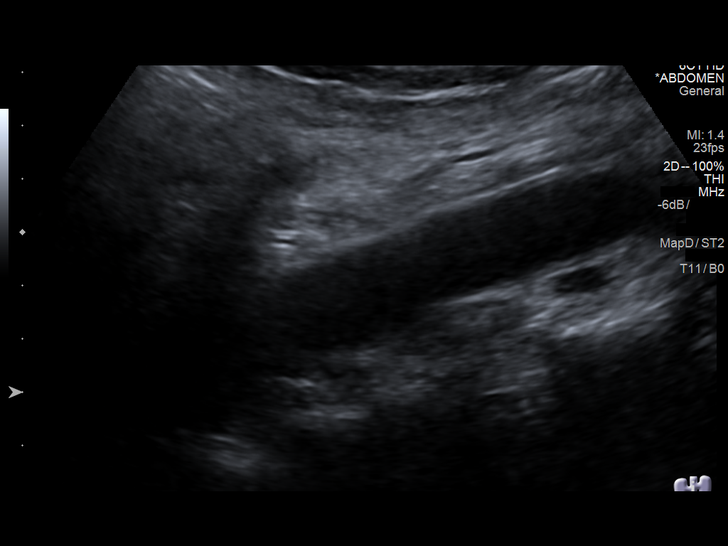
[im 15/33]
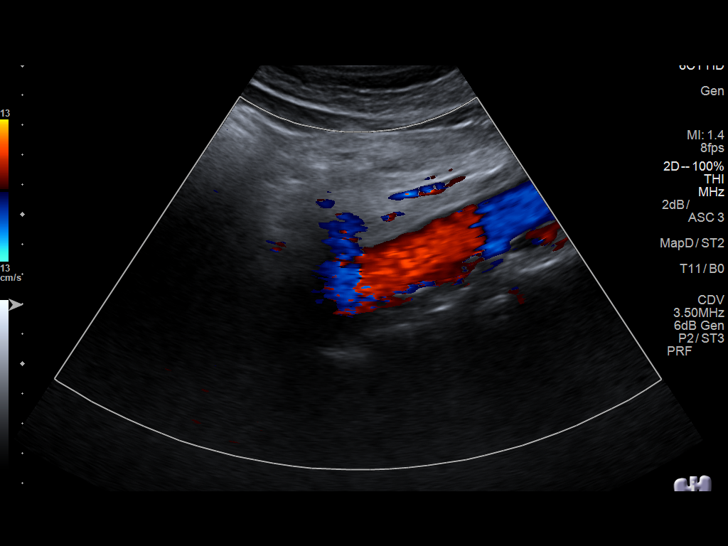
[im 18/33]
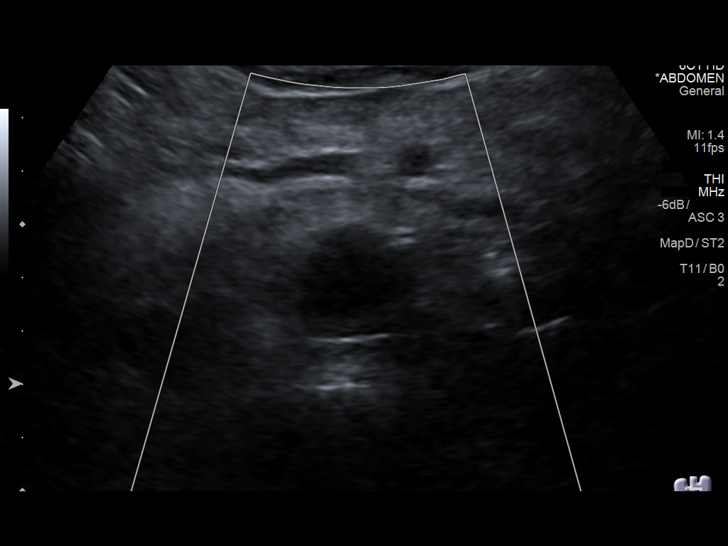
[im 21/33]
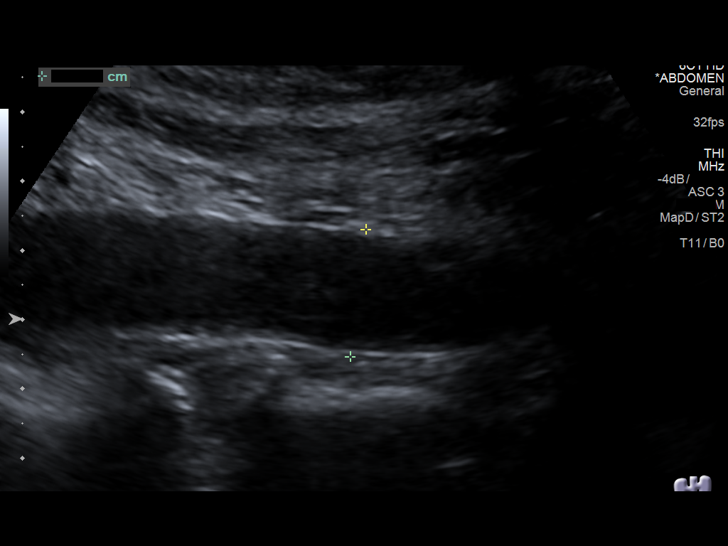
[im 22/33]
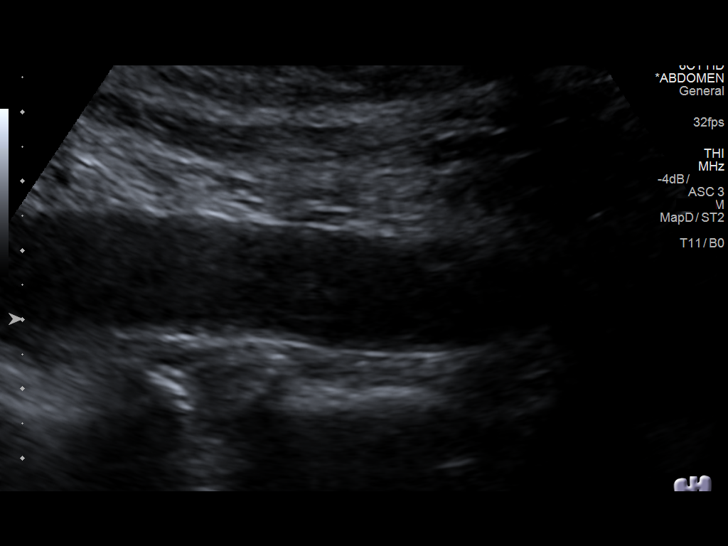
[im 25/33]
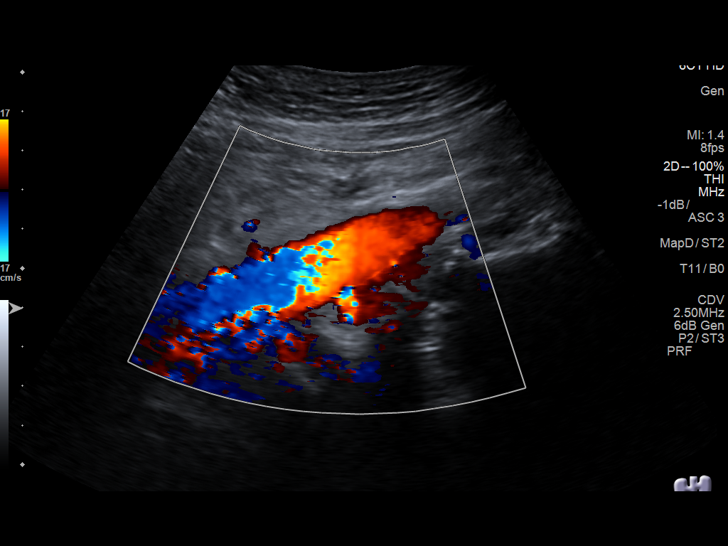
[im 27/33]
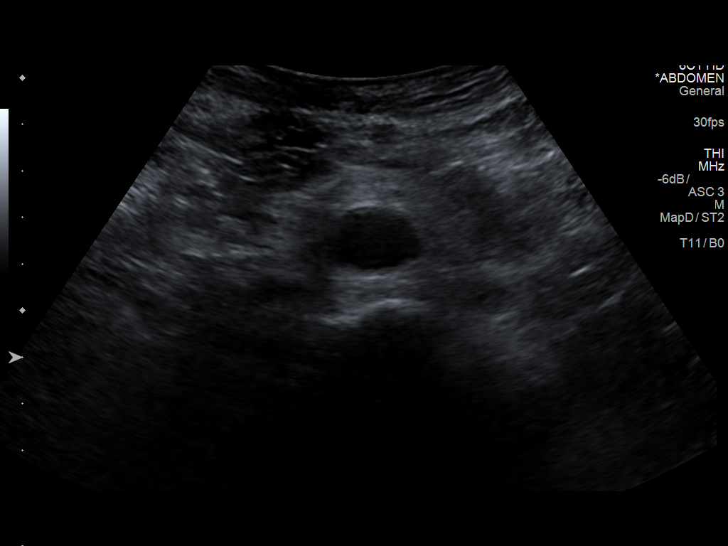
[im 30/33]
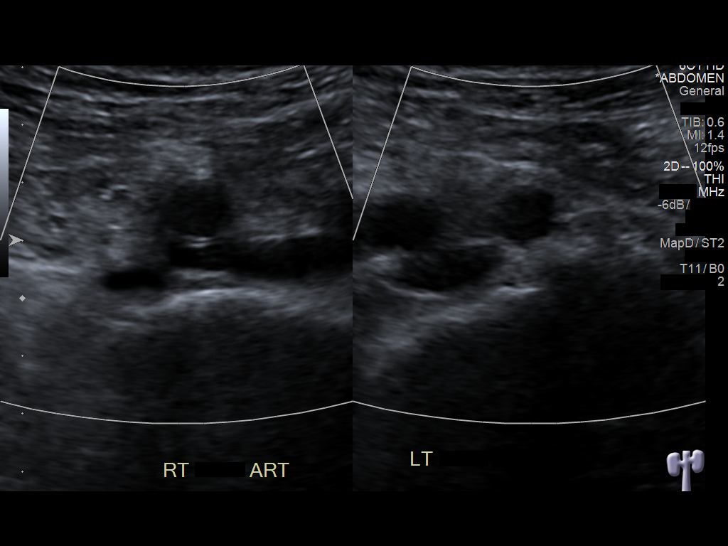
[im 33/33]
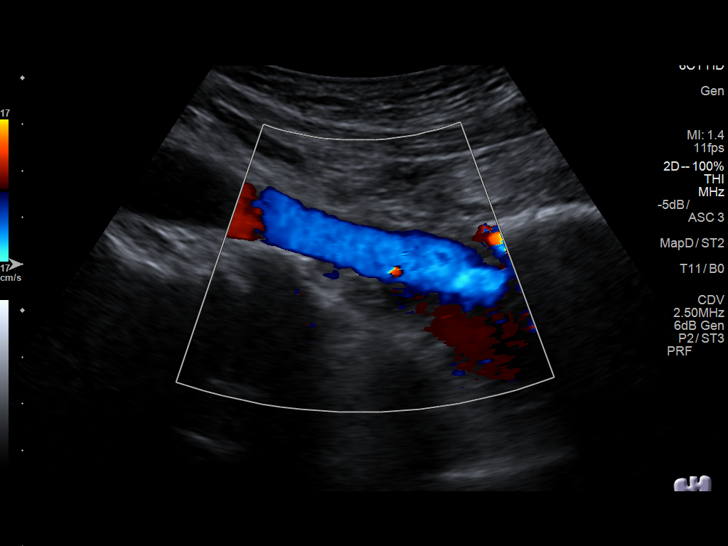

[14 of 25 positions shown; findings below may reference images not displayed]

FINDINGS: Abdominal aortic measurements as follows:

Proximal:  2.7 cm

Mid:  2.4 cm

Distal:  1.9 cm
IMPRESSION: Maximal diameter of the abdominal aorta is 2.7 cm. Ectatic abdominal
aorta at risk for aneurysm development. Recommend followup by
ultrasound in 5 years. This recommendation follows ACR consensus
guidelines: White Paper of the ACR Incidental Findings Committee II

## 2019-02-22 DIAGNOSIS — H401121 Primary open-angle glaucoma, left eye, mild stage: Secondary | ICD-10-CM | POA: Diagnosis not present

## 2019-02-22 DIAGNOSIS — H04123 Dry eye syndrome of bilateral lacrimal glands: Secondary | ICD-10-CM | POA: Diagnosis not present

## 2019-02-22 DIAGNOSIS — H401112 Primary open-angle glaucoma, right eye, moderate stage: Secondary | ICD-10-CM | POA: Diagnosis not present

## 2019-02-22 DIAGNOSIS — H1851 Endothelial corneal dystrophy: Secondary | ICD-10-CM | POA: Diagnosis not present

## 2019-02-22 DIAGNOSIS — H25813 Combined forms of age-related cataract, bilateral: Secondary | ICD-10-CM | POA: Diagnosis not present

## 2019-04-19 NOTE — Progress Notes (Deleted)
Subjective:   Cody Stephens is a 78 y.o. male who presents for an Initial Medicare Annual Wellness Visit.  Review of Systems  No ROS.  Medicare Wellness Virtual Visit.  Visual/audio telehealth visit, UTA vital signs.   See social history for additional risk factors.      Sleep patterns:  Home Safety/Smoke Alarms: Feels safe in home. Smoke alarms in place.  Living environment;  Seat Belt Safety/Bike Helmet: Wears seat belt.     Male:   CCS-     PSA- No results found for: PSA      Objective:    There were no vitals filed for this visit. There is no height or weight on file to calculate BMI.  Advanced Directives 11/02/2018  Does Patient Have a Medical Advance Directive? No    Current Medications (verified) Outpatient Encounter Medications as of 04/24/2019  Medication Sig  . amLODipine (NORVASC) 5 MG tablet Take 1 tablet (5 mg total) by mouth daily.  Marland Kitchen. latanoprost (XALATAN) 0.005 % ophthalmic solution 1 drop at bedtime.   No facility-administered encounter medications on file as of 04/24/2019.     Allergies (verified) Patient has no known allergies.   History: Past Medical History:  Diagnosis Date  . Glaucoma   . Hypertension    on meds   Past Surgical History:  Procedure Laterality Date  . DENTAL SURGERY  2016   implant   Family History  Problem Relation Age of Onset  . Hypertension Mother    Social History   Socioeconomic History  . Marital status: Married    Spouse name: Bosie ClosJudith  . Number of children: Not on file  . Years of education: Not on file  . Highest education level: Doctorate  Occupational History  . Occupation: Retired Physicist, medicalChemist  Social Needs  . Financial resource strain: Not on file  . Food insecurity    Worry: Not on file    Inability: Not on file  . Transportation needs    Medical: Not on file    Non-medical: Not on file  Tobacco Use  . Smoking status: Never Smoker  . Smokeless tobacco: Never Used  Substance and Sexual  Activity  . Alcohol use: Yes    Comment: occ  . Drug use: Never  . Sexual activity: Not on file  Lifestyle  . Physical activity    Days per week: Not on file    Minutes per session: Not on file  . Stress: Not on file  Relationships  . Social Musicianconnections    Talks on phone: Not on file    Gets together: Not on file    Attends religious service: Not on file    Active member of club or organization: Not on file    Attends meetings of clubs or organizations: Not on file    Relationship status: Not on file  Other Topics Concern  . Not on file  Social History Narrative  . Not on file   Tobacco Counseling Counseling given: Not Answered   Clinical Intake:                       Activities of Daily Living No flowsheet data found.   Immunizations and Health Maintenance  There is no immunization history on file for this patient. Health Maintenance Due  Topic Date Due  . TETANUS/TDAP  06/14/1962  . COLONOSCOPY  06/14/1993  . PNA vac Low Risk Adult (1 of 2 - PCV13) 06/14/2008  Patient Care Team: Kris Mouton as PCP - General (Physician Assistant)  Indicate any recent Medical Services you may have received from other than [facility] providers in the past year (date may be approximate).    Assessment:   This is a routine wellness examination for [place].Physical assessment deferred to PCP.   Hearing/Vision screen No exam data present  Dietary issues and exercise activities discussed:   Diet  Breakfast: Lunch:  Dinner:       Goals   None    Depression Screen PHQ 2/9 Scores 11/24/2018 10/17/2018 02/28/2018 02/28/2018  PHQ - 2 Score 0 0 0 0    Fall Risk Fall Risk  02/28/2018  Falls in the past year? No    Is the patient's home free of loose throw rugs in walkways, pet beds, electrical cords, etc?   {Blank single:19197::"yes","no"}      Grab bars in the bathroom? {Blank single:19197::"yes","no"}      Handrails on the stairs?   {Blank  single:19197::"yes","no"}      Adequate lighting?   {Blank single:19197::"yes","no"}   Cognitive Function:        Screening Tests Health Maintenance  Topic Date Due  . TETANUS/TDAP  06/14/1962  . COLONOSCOPY  06/14/1993  . PNA vac Low Risk Adult (1 of 2 - PCV13) 06/14/2008  . INFLUENZA VACCINE  05/13/2019         Plan:   ***  I have personally reviewed and noted the following in the patient's chart:   . Medical and social history . Use of alcohol, tobacco or illicit drugs  . Current medications and supplements . Functional ability and status . Nutritional status . Physical activity . Advanced directives . List of other physicians . Hospitalizations, surgeries, and ER visits in previous 12 months . Vitals . Screenings to include cognitive, depression, and falls . Referrals and appointments  In addition, I have reviewed and discussed with patient certain preventive protocols, quality metrics, and best practice recommendations. A written personalized care plan for preventive services as well as general preventive health recommendations were provided to patient.     Joanne Chars, LPN   12/12/6710

## 2019-04-24 ENCOUNTER — Ambulatory Visit: Payer: Medicare Other

## 2019-04-24 ENCOUNTER — Other Ambulatory Visit: Payer: Self-pay

## 2019-04-24 ENCOUNTER — Other Ambulatory Visit: Payer: Self-pay | Admitting: Physician Assistant

## 2019-04-24 DIAGNOSIS — I10 Essential (primary) hypertension: Secondary | ICD-10-CM

## 2019-04-24 MED ORDER — AMLODIPINE BESYLATE 5 MG PO TABS: 5.0000 mg | ORAL_TABLET | Freq: Every day | ORAL | 0 refills | Status: DC

## 2019-05-02 NOTE — Progress Notes (Signed)
Subjective:   Cody Stephens is a 78 y.o. male who presents for an Initial Medicare Annual Wellness Visit.  Review of Systems  No ROS.  Medicare Wellness Virtual Visit.  Visual/audio telehealth visit, UTA vital signs.   See social history for additional risk factors.    Cardiac Risk Factors include: advanced age (>755men, 80>65 women);hypertension Sleep patterns: Getting 7-8 hours of sleep a night. Does not wake up to void during the night. Wakes up and feels refreshed.   Home Safety/Smoke Alarms: Feels safe in home. Smoke alarms in place.  Living environment;Lives with wife in a 1 1/2 story home. Stairs have handrails on them. Shower is a walk in shower with a bench in it. Seat Belt Safety/Bike Helmet: Wears seat belt.  Male:   CCS-  Aged out   PSA- No results found for: PSA      Objective:    Today's Vitals   05/03/19 1120  BP: 129/78  Pulse: 68  Temp: (!) 97.4 F (36.3 C)  TempSrc: Oral  Weight: 166 lb 8 oz (75.5 kg)  Height: 5\' 10"  (1.778 m)   Body mass index is 23.89 kg/m.  Advanced Directives 05/03/2019 11/02/2018  Does Patient Have a Medical Advance Directive? No No  Would patient like information on creating a medical advance directive? No - Patient declined -    Current Medications (verified) Outpatient Encounter Medications as of 05/03/2019  Medication Sig  . latanoprost (XALATAN) 0.005 % ophthalmic solution 1 drop at bedtime.  Marland Kitchen. amLODipine (NORVASC) 5 MG tablet Take 1 tablet (5 mg total) by mouth daily. Due for follow up in August (Patient not taking: Reported on 05/03/2019)   No facility-administered encounter medications on file as of 05/03/2019.     Allergies (verified) Patient has no known allergies.   History: Past Medical History:  Diagnosis Date  . Glaucoma   . Hypertension    on meds   Past Surgical History:  Procedure Laterality Date  . DENTAL SURGERY  2016   implant   Family History  Problem Relation Age of Onset  . Hypertension  Mother    Social History   Socioeconomic History  . Marital status: Married    Spouse name: Cody Stephens  . Number of children: 3  . Years of education: 3516  . Highest education level: Doctorate  Occupational History  . Occupation: Retired Charity fundraiserChemist    Comment: retired  Engineer, productionocial Needs  . Financial resource strain: Not hard at all  . Food insecurity    Worry: Never true    Inability: Never true  . Transportation needs    Medical: No    Non-medical: No  Tobacco Use  . Smoking status: Never Smoker  . Smokeless tobacco: Never Used  Substance and Sexual Activity  . Alcohol use: Yes    Comment: occasionally  . Drug use: Never  . Sexual activity: Not Currently  Lifestyle  . Physical activity    Days per week: 3 days    Minutes per session: 20 min  . Stress: Not at all  Relationships  . Social Musicianconnections    Talks on phone: Once a week    Gets together: Once a week    Attends religious service: Never    Active member of club or organization: Yes    Attends meetings of clubs or organizations: More than 4 times per year    Relationship status: Married  Other Topics Concern  . Not on file  Social History Narrative  Like to square dance and travels doing that when able   Tobacco Counseling Counseling given: Not Answered   Clinical Intake:  Pre-visit preparation completed: Yes  Pain : No/denies pain     Nutritional Risks: None Diabetes: No  How often do you need to have someone help you when you read instructions, pamphlets, or other written materials from your doctor or pharmacy?: 1 - Never What is the last grade level you completed in school?: 16  Interpreter Needed?: No  Information entered by :: Orlie Dakin, LPN  Activities of Daily Living In your present state of health, do you have any difficulty performing the following activities: 05/03/2019  Hearing? Y  Comment wears hearing aids bilaterally  Vision? N  Difficulty concentrating or making decisions? N   Walking or climbing stairs? N  Dressing or bathing? N  Doing errands, shopping? N  Preparing Food and eating ? N  Using the Toilet? N  In the past six months, have you accidently leaked urine? N  Do you have problems with loss of bowel control? N  Managing your Medications? N  Managing your Finances? N  Housekeeping or managing your Housekeeping? N  Some recent data might be hidden     Immunizations and Health Maintenance  There is no immunization history on file for this patient. Health Maintenance Due  Topic Date Due  . TETANUS/TDAP  06/14/1962  . COLONOSCOPY  06/14/1993  . PNA vac Low Risk Adult (1 of 2 - PCV13) 06/14/2008    Patient Care Team: Kris Mouton as PCP - General (Physician Assistant)  Indicate any recent Medical Services you may have received from other than [facility] providers in the past year (date may be approximate).    Assessment:   This is a routine wellness examination for Cody Stephens.Physical assessment deferred to PCP.   Hearing/Vision screen No exam data present  Dietary issues and exercise activities discussed: Current Exercise Habits: Home exercise routine, Type of exercise: walking, Time (Minutes): 20, Frequency (Times/Week): 3, Weekly Exercise (Minutes/Week): 60, Intensity: Mild, Exercise limited by: None identified Diet Eats a healthy diet of fruits and vegetables. Drinks milk in the mornings and eats yogurt. Breakfast: cereal with fruit and english muffin Lunch:sandwich with a cookie and fruit  Dinner: Meat and vegetables- chicken, [place], beef       Goals    . Exercise 3x per week (30 min per time)     Exercise more than he is right now. Wants to get back into the square dancing once COVID is over.      Depression Screen PHQ 2/9 Scores 05/03/2019 11/24/2018 10/17/2018 02/28/2018  PHQ - 2 Score 0 0 0 0    Fall Risk Fall Risk  05/03/2019 02/28/2018  Falls in the past year? 0 No  Follow up Falls prevention discussed -    Is  the patient's home free of loose throw rugs in walkways, pet beds, electrical cords, etc?   yes      Grab bars in the bathroom? no      Handrails on the stairs?   yes      Adequate lighting?   yes   Cognitive Function:     6CIT Screen 05/03/2019  What Year? 0 points  What month? 0 points  What time? 0 points  Count back from 20 0 points  Months in reverse 0 points  Repeat phrase 0 points  Total Score 0    Screening Tests Health Maintenance  Topic Date Due  .  TETANUS/TDAP  06/14/1962  . COLONOSCOPY  06/14/1993  . PNA vac Low Risk Adult (1 of 2 - PCV13) 06/14/2008  . INFLUENZA VACCINE  05/13/2019        Plan:      Cody Stephens , Thank you for taking time to come for your Medicare Wellness Visit. I appreciate your ongoing commitment to your health goals. Please review the following plan we discussed and let me know if I can assist you in the future.  Please schedule your next medicare wellness visit with me in 1 yr. Continue doing brain stimulating activities (puzzles, reading, adult coloring books, staying active) to keep memory sharp.    These are the goals we discussed: Goals    . Exercise 3x per week (30 min per time)     Exercise more than he is right now. Wants to get back into the square dancing once COVID is over.       This is a list of the screening recommended for you and due dates:  Health Maintenance  Topic Date Due  . Tetanus Vaccine  06/14/1962  . Colon Cancer Screening  06/14/1993  . Pneumonia vaccines (1 of 2 - PCV13) 06/14/2008  . Flu Shot  05/13/2019     I have personally reviewed and noted the following in the patient's chart:   . Medical and social history . Use of alcohol, tobacco or illicit drugs  . Current medications and supplements . Functional ability and status . Nutritional status . Physical activity . Advanced directives . List of other physicians . Hospitalizations, surgeries, and ER visits in previous 12  months . Vitals . Screenings to include cognitive, depression, and falls . Referrals and appointments  In addition, I have reviewed and discussed with patient certain preventive protocols, quality metrics, and best practice recommendations. A written personalized care plan for preventive services as well as general preventive health recommendations were provided to patient.     Normand SloopKimberly A Zhuri Krass, LPN   1/61/09607/22/2020

## 2019-05-03 ENCOUNTER — Ambulatory Visit (INDEPENDENT_AMBULATORY_CARE_PROVIDER_SITE_OTHER): Payer: Medicare Other | Admitting: *Deleted

## 2019-05-03 VITALS — BP 129/78 | HR 68 | Temp 97.4°F | Ht 70.0 in | Wt 166.5 lb

## 2019-05-03 DIAGNOSIS — Z0000 Encounter for general adult medical examination without abnormal findings: Secondary | ICD-10-CM | POA: Diagnosis not present

## 2019-05-03 NOTE — Patient Instructions (Addendum)
Mr. Creeden , Thank you for taking time to come for your Medicare Wellness Visit. I appreciate your ongoing commitment to your health goals. Please review the following plan we discussed and let me know if I can assist you in the future.  Please schedule your next medicare wellness visit with me in 1 yr. Continue doing brain stimulating activities (puzzles, reading, adult coloring books, staying active) to keep memory sharp.   These are the goals we discussed: Goals    . Exercise 3x per week (30 min per time)     Exercise more than he is right now. Wants to get back into the square dancing once COVID is over.

## 2019-06-14 ENCOUNTER — Ambulatory Visit: Payer: Medicare Other | Admitting: Cardiology

## 2019-07-03 ENCOUNTER — Encounter: Payer: Self-pay | Admitting: Cardiology

## 2019-07-03 ENCOUNTER — Other Ambulatory Visit: Payer: Self-pay

## 2019-07-03 ENCOUNTER — Ambulatory Visit (INDEPENDENT_AMBULATORY_CARE_PROVIDER_SITE_OTHER): Payer: Medicare Other | Admitting: Cardiology

## 2019-07-03 VITALS — BP 160/90 | HR 74 | Temp 97.0°F | Ht 70.0 in | Wt 173.0 lb

## 2019-07-03 DIAGNOSIS — R002 Palpitations: Secondary | ICD-10-CM

## 2019-07-03 DIAGNOSIS — Z13220 Encounter for screening for lipoid disorders: Secondary | ICD-10-CM

## 2019-07-03 DIAGNOSIS — I10 Essential (primary) hypertension: Secondary | ICD-10-CM | POA: Insufficient documentation

## 2019-07-03 HISTORY — DX: Essential (primary) hypertension: I10

## 2019-07-03 NOTE — Progress Notes (Signed)
Cardiology Office Note:    Date:  07/03/2019   ID:  Cody Stephens, DOB [birth year], MRN [id number]  PCP:  Carlis Stable, PA-C  Cardiologist:  Garwin Brothers, MD   Referring MD: Donzetta Kohut*    ASSESSMENT:    1. Essential hypertension    PLAN:    In order of problems listed above:  1. Primary prevention stressed with the patient.  Importance of compliance with diet and medication stressed and he vocalized understanding.  His blood pressure is stable.  He has an element of whitecoat hypertension.  He mentions several blood work readings from home and they are fine 2. He will come back in the next few days for complete blood work including fasting lipids 3. Patient will be seen in follow-up appointment in 6 months or earlier if the patient has any concerns    Medication Adjustments/Labs and Tests Ordered: Current medicines are reviewed at length with the patient today.  Concerns regarding medicines are outlined above.  No orders of the defined types were placed in this encounter.  No orders of the defined types were placed in this encounter.    Chief Complaint  Patient presents with  . Follow-up     History of Present Illness:    Cody Stephens is a 78 y.o. male.  Patient has past medical history of essential hypertension.  He denies any problems at this time and takes care of activities of daily living.  No chest pain orthopnea or PND.  At the time of my evaluation, the patient is alert awake oriented and in no distress.  He exercises on a regular basis.  Past Medical History:  Diagnosis Date  . Glaucoma   . Hypertension    on meds    Past Surgical History:  Procedure Laterality Date  . DENTAL SURGERY  2016   implant    Current Medications: Current Meds  Medication Sig  . latanoprost (XALATAN) 0.005 % ophthalmic solution 1 drop at bedtime.     Allergies:   Patient has no known allergies.   Social History   Socioeconomic History   . Marital status: Married    Spouse name: Bosie Clos  . Number of children: 3  . Years of education: 60  . Highest education level: Doctorate  Occupational History  . Occupation: Retired Charity fundraiser    Comment: retired  Engineer, production  . Financial resource strain: Not hard at all  . Food insecurity    Worry: Never true    Inability: Never true  . Transportation needs    Medical: No    Non-medical: No  Tobacco Use  . Smoking status: Never Smoker  . Smokeless tobacco: Never Used  Substance and Sexual Activity  . Alcohol use: Yes    Comment: occasionally  . Drug use: Never  . Sexual activity: Not Currently  Lifestyle  . Physical activity    Days per week: 3 days    Minutes per session: 20 min  . Stress: Not at all  Relationships  . Social Musician on phone: Once a week    Gets together: Once a week    Attends religious service: Never    Active member of club or organization: Yes    Attends meetings of clubs or organizations: More than 4 times per year    Relationship status: Married  Other Topics Concern  . Not on file  Social History Narrative   Like to square dance and travels  doing that when able     Family History: The patient's family history includes Hypertension in his mother.  ROS:   Please see the history of present illness.    All other systems reviewed and are negative.  EKGs/Labs/Other Studies Reviewed:    The following studies were reviewed today: I discussed my findings with the patient at length including last lab work   Recent Labs: 11/02/2018: ALT 11; BUN 15; Creatinine, Ser 1.15; Hemoglobin 16.2; Platelets 221; Potassium 3.7; Sodium 139  Recent Lipid Panel    Component Value Date/Time   CHOL 191 02/28/2018 1618   TRIG 91 02/28/2018 1618   HDL 51 02/28/2018 1618   CHOLHDL 3.7 02/28/2018 1618   LDLCALC 121 (H) 02/28/2018 1618    Physical Exam:    VS:  BP (!) 160/90 (BP Location: Right Arm, Patient Position: Sitting, Cuff Size:  Normal)   Pulse 74   Temp (!) 97 F (36.1 C)   Ht 5\' 10"  (1.778 m)   Wt 173 lb (78.5 kg)   SpO2 99%   BMI 24.82 kg/m     Wt Readings from Last 3 Encounters:  07/03/19 173 lb (78.5 kg)  05/03/19 166 lb 8 oz (75.5 kg)  12/14/18 178 lb (80.7 kg)     GEN: Patient is in no acute distress HEENT: Normal NECK: No JVD; No carotid bruits LYMPHATICS: No lymphadenopathy CARDIAC: Hear sounds regular, 2/6 systolic murmur at the apex. RESPIRATORY:  Clear to auscultation without rales, wheezing or rhonchi  ABDOMEN: Soft, non-tender, non-distended MUSCULOSKELETAL:  No edema; No deformity  SKIN: Warm and dry NEUROLOGIC:  Alert and oriented x 3 PSYCHIATRIC:  Normal affect   Signed, Jenean Lindau, MD  07/03/2019 11:31 AM    [place]

## 2019-07-03 NOTE — Patient Instructions (Signed)
Medication Instructions:  Your physician recommends that you continue on your current medications as directed. Please refer to the Current Medication list given to you today.  If you need a refill on your cardiac medications before your next appointment, please call your pharmacy.   Lab work: Your physician recommends that you return FASTING this week for BMP, CBC, hepatic, TSH and lipid panel to be drawn.  If you have labs (blood work) drawn today and your tests are completely normal, you will receive your results only by: Marland Kitchen MyChart Message (if you have MyChart) OR . A paper copy in the mail If you have any lab test that is abnormal or we need to change your treatment, we will call you to review the results.  Testing/Procedures: NONE  Follow-Up: At [facility], you and your health needs are our priority.  As part of our continuing mission to provide you with exceptional heart care, we have created designated Provider Care Teams.  These Care Teams include your primary Cardiologist (physician) and Advanced Practice Providers (APPs -  Physician Assistants and Nurse Practitioners) who all work together to provide you with the care you need, when you need it. You will need a follow up appointment in 6 months.

## 2019-07-03 NOTE — Addendum Note (Signed)
Addended by: Beckey Rutter on: 07/03/2019 11:43 AM   Modules accepted: Orders

## 2019-07-14 DIAGNOSIS — Z13220 Encounter for screening for lipoid disorders: Secondary | ICD-10-CM | POA: Diagnosis not present

## 2019-07-14 DIAGNOSIS — I10 Essential (primary) hypertension: Secondary | ICD-10-CM | POA: Diagnosis not present

## 2019-07-14 DIAGNOSIS — R002 Palpitations: Secondary | ICD-10-CM | POA: Diagnosis not present

## 2019-07-15 LAB — LIPID PANEL
Chol/HDL Ratio: 3.5 ratio (ref 0.0–5.0)
Cholesterol, Total: 179 mg/dL (ref 100–199)
HDL: 51 mg/dL (ref >39)
LDL Chol Calc (NIH): 118 mg/dL — ABNORMAL HIGH (ref 0–99)
Triglycerides: 52 mg/dL (ref 0–149)
VLDL Cholesterol Cal: 10 mg/dL (ref 5–40)

## 2019-07-15 LAB — BASIC METABOLIC PANEL
BUN/Creatinine Ratio: 13 (ref 10–24)
BUN: 16 mg/dL (ref 8–27)
CO2: 26 mmol/L (ref 20–29)
Calcium: 9.2 mg/dL (ref 8.6–10.2)
Chloride: 105 mmol/L (ref 96–106)
Creatinine, Ser: 1.21 mg/dL (ref 0.76–1.27)
GFR calc Af Amer: 67 mL/min/{1.73_m2} (ref >59)
GFR calc non Af Amer: 58 mL/min/{1.73_m2} — ABNORMAL LOW (ref >59)
Glucose: 85 mg/dL (ref 65–99)
Potassium: 4.2 mmol/L (ref 3.5–5.2)
Sodium: 141 mmol/L (ref 134–144)

## 2019-07-15 LAB — CBC
Hematocrit: 49.1 % (ref 37.5–51.0)
Hemoglobin: 16.2 g/dL (ref 13.0–17.7)
MCH: 31.2 pg (ref 26.6–33.0)
MCHC: 33 g/dL (ref 31.5–35.7)
MCV: 95 fL (ref 79–97)
Platelets: 186 10*3/uL (ref 150–450)
RBC: 5.19 x10E6/uL (ref 4.14–5.80)
RDW: 13.1 % (ref 11.6–15.4)
WBC: 5.8 10*3/uL (ref 3.4–10.8)

## 2019-07-15 LAB — TSH: TSH: 4.4 u[IU]/mL (ref 0.450–4.500)

## 2019-07-15 LAB — HEPATIC FUNCTION PANEL
ALT: 6 IU/L (ref 0–44)
AST: 15 IU/L (ref 0–40)
Albumin: 4.1 g/dL (ref 3.7–4.7)
Alkaline Phosphatase: 72 IU/L (ref 39–117)
Bilirubin Total: 0.4 mg/dL (ref 0.0–1.2)
Bilirubin, Direct: 0.12 mg/dL (ref 0.00–0.40)
Total Protein: 6.2 g/dL (ref 6.0–8.5)

## 2019-08-23 DIAGNOSIS — H401121 Primary open-angle glaucoma, left eye, mild stage: Secondary | ICD-10-CM | POA: Diagnosis not present

## 2019-08-23 DIAGNOSIS — H18513 Endothelial corneal dystrophy, bilateral: Secondary | ICD-10-CM | POA: Diagnosis not present

## 2019-08-23 DIAGNOSIS — H04123 Dry eye syndrome of bilateral lacrimal glands: Secondary | ICD-10-CM | POA: Diagnosis not present

## 2019-08-23 DIAGNOSIS — H25813 Combined forms of age-related cataract, bilateral: Secondary | ICD-10-CM | POA: Diagnosis not present

## 2019-08-23 DIAGNOSIS — H401112 Primary open-angle glaucoma, right eye, moderate stage: Secondary | ICD-10-CM | POA: Diagnosis not present

## 2019-09-04 ENCOUNTER — Other Ambulatory Visit: Payer: Self-pay

## 2019-09-04 ENCOUNTER — Ambulatory Visit (INDEPENDENT_AMBULATORY_CARE_PROVIDER_SITE_OTHER): Payer: Medicare Other | Admitting: Family Medicine

## 2019-09-04 DIAGNOSIS — Z23 Encounter for immunization: Secondary | ICD-10-CM

## 2019-10-23 ENCOUNTER — Ambulatory Visit: Payer: Medicare Other | Attending: Internal Medicine

## 2019-10-23 DIAGNOSIS — Z23 Encounter for immunization: Secondary | ICD-10-CM

## 2019-10-23 NOTE — Progress Notes (Signed)
   Covid-19 Vaccination Clinic  Name:  TARUS BRISKI    MRN: [id number] DOB: November 29, 1940  10/23/2019  Mr. Cubero was observed post Covid-19 immunization for 15 minutes without incidence. He was provided with Vaccine Information Sheet and instruction to access the V-Safe system.   Mr. Pellow was instructed to call 911 with any severe reactions post vaccine: Marland Kitchen Difficulty breathing  . Swelling of your face and throat  . A fast heartbeat  . A bad rash all over your body  . Dizziness and weakness    Immunizations Administered    Name Date Dose VIS Date Route   Pfizer COVID-19 Vaccine 10/23/2019 11:15 AM 0.3 mL 09/22/2019 Intramuscular   Manufacturer: [employer], [employer]   Lot: [id number]   NDC: [address]-3

## 2019-11-11 ENCOUNTER — Ambulatory Visit: Payer: Medicare Other | Attending: Internal Medicine

## 2019-11-11 ENCOUNTER — Ambulatory Visit: Payer: Medicare Other

## 2019-11-11 DIAGNOSIS — Z23 Encounter for immunization: Secondary | ICD-10-CM | POA: Insufficient documentation

## 2019-11-11 NOTE — Progress Notes (Signed)
   Covid-19 Vaccination Clinic  Name:  CAMP GOPAL    MRN: [id number] DOB: Jun 05, 1941  11/11/2019  Mr. Naclerio was observed post Covid-19 immunization for 15 minutes without incidence. He was provided with Vaccine Information Sheet and instruction to access the V-Safe system.   Mr. Gaulin was instructed to call 911 with any severe reactions post vaccine: Marland Kitchen Difficulty breathing  . Swelling of your face and throat  . A fast heartbeat  . A bad rash all over your body  . Dizziness and weakness    Immunizations Administered    Name Date Dose VIS Date Route   Pfizer COVID-19 Vaccine 11/11/2019  1:39 PM 0.3 mL 09/22/2019 Intramuscular   Manufacturer: [employer], [employer]   Lot: [id number]   NDC: [address]-4

## 2019-11-22 DIAGNOSIS — H401121 Primary open-angle glaucoma, left eye, mild stage: Secondary | ICD-10-CM | POA: Diagnosis not present

## 2019-11-22 DIAGNOSIS — H401112 Primary open-angle glaucoma, right eye, moderate stage: Secondary | ICD-10-CM | POA: Diagnosis not present

## 2019-11-22 DIAGNOSIS — H04123 Dry eye syndrome of bilateral lacrimal glands: Secondary | ICD-10-CM | POA: Diagnosis not present

## 2019-11-22 DIAGNOSIS — H25813 Combined forms of age-related cataract, bilateral: Secondary | ICD-10-CM | POA: Diagnosis not present

## 2019-11-22 DIAGNOSIS — H18513 Endothelial corneal dystrophy, bilateral: Secondary | ICD-10-CM | POA: Diagnosis not present

## 2019-11-28 DIAGNOSIS — H25813 Combined forms of age-related cataract, bilateral: Secondary | ICD-10-CM | POA: Diagnosis not present

## 2019-12-07 DIAGNOSIS — H2511 Age-related nuclear cataract, right eye: Secondary | ICD-10-CM | POA: Diagnosis not present

## 2019-12-07 DIAGNOSIS — H401112 Primary open-angle glaucoma, right eye, moderate stage: Secondary | ICD-10-CM | POA: Diagnosis not present

## 2019-12-27 ENCOUNTER — Other Ambulatory Visit: Payer: Self-pay

## 2019-12-27 ENCOUNTER — Encounter: Payer: Self-pay | Admitting: Cardiology

## 2019-12-27 ENCOUNTER — Ambulatory Visit (INDEPENDENT_AMBULATORY_CARE_PROVIDER_SITE_OTHER): Payer: Medicare Other | Admitting: Cardiology

## 2019-12-27 VITALS — BP 140/80 | HR 62 | Ht 70.0 in | Wt 172.0 lb

## 2019-12-27 DIAGNOSIS — Z13220 Encounter for screening for lipoid disorders: Secondary | ICD-10-CM | POA: Diagnosis not present

## 2019-12-27 DIAGNOSIS — Z1329 Encounter for screening for other suspected endocrine disorder: Secondary | ICD-10-CM | POA: Diagnosis not present

## 2019-12-27 DIAGNOSIS — R002 Palpitations: Secondary | ICD-10-CM | POA: Diagnosis not present

## 2019-12-27 DIAGNOSIS — I10 Essential (primary) hypertension: Secondary | ICD-10-CM

## 2019-12-27 NOTE — Patient Instructions (Signed)
Medication Instructions:  No medication changes *If you need a refill on your cardiac medications before your next appointment, please call your pharmacy*   Lab Work: You need to have labs done in the near future when you are fasting.  You can come Monday through Friday 8:30 am to 12:00 pm and 1:15 to 4:30. You do not need to make an appointment as the order has already been placed. The labs you are going to have done are BMET, CBC, TSH, LFT and Lipids.  If you have labs (blood work) drawn today and your tests are completely normal, you will receive your results only by: Marland Kitchen MyChart Message (if you have MyChart) OR . A paper copy in the mail If you have any lab test that is abnormal or we need to change your treatment, we will call you to review the results.   Testing/Procedures: None ordered   Follow-Up: At [facility], you and your health needs are our priority.  As part of our continuing mission to provide you with exceptional heart care, we have created designated Provider Care Teams.  These Care Teams include your primary Cardiologist (physician) and Advanced Practice Providers (APPs -  Physician Assistants and Nurse Practitioners) who all work together to provide you with the care you need, when you need it.  We recommend signing up for the patient portal called "MyChart".  Sign up information is provided on this After Visit Summary.  MyChart is used to connect with patients for Virtual Visits (Telemedicine).  Patients are able to view lab/test results, encounter notes, upcoming appointments, etc.  Non-urgent messages can be sent to your provider as well.   To learn more about what you can do with MyChart, go to [email or web address].    Your next appointment:   12 month(s)  The format for your next appointment:   In Person  Provider:   Belva Crome, MD   Other Instructions NA

## 2019-12-27 NOTE — Progress Notes (Signed)
Cardiology Office Note:    Date:  12/27/2019   ID:  Cody Stephens, DOB [birth year], MRN [id number]  PCP:  Cody Dredge, PA-C  Cardiologist:  Cody Lindau, MD   Referring MD: Cody Stephens*    ASSESSMENT:    1. Thyroid disorder screen   2. Essential hypertension   3. Palpitations   4. Screening cholesterol level    PLAN:    In order of problems listed above:  1. Primary prevention stressed with the patient.  Importance of compliance with diet and medication stressed and he vocalized understanding. 2. Essential hypertension: Blood pressure stable with lifestyle modification.  He is on no medications now.  I congratulated him about this. 3. He will be back in the next few days for blood work including fasting lipids for cardiovascular stratification. 4. He will be seen in follow-up appointment on annual basis or earlier if he has any concerns.  I urged him to go back to exercising at least 30 minutes a day 5 days a week and he promises to do so.   Medication Adjustments/Labs and Tests Ordered: Current medicines are reviewed at length with the patient today.  Concerns regarding medicines are outlined above.  Orders Placed This Encounter  Procedures  . Basic metabolic panel  . CBC with Differential/Platelet  . Hepatic function panel  . Lipid panel  . TSH  . EKG 12-Lead   No orders of the defined types were placed in this encounter.    Chief Complaint  Patient presents with  . Follow-up     History of Present Illness:    Cody Stephens is a 79 y.o. male.  Patient has past medical history of essential hypertension.  He denies any problems at this time and takes care of activities of daily living.  His palpitations have completely resolved.  He leads a sedentary lifestyle now.  He used to be doing dance for exercise in the past.  At the time of my evaluation, the patient is alert awake oriented and in no distress.  He stopped taking his blood  pressure medication is managed with lifestyle modification and his blood pressure is in the range of 130/70 at home and he mentioned to me these readings.  Is a very intelligent gentleman.  At the time of my evaluation, the patient is alert awake oriented and in no distress.  Past Medical History:  Diagnosis Date  . Colon cancer screening declined 02/28/2018  . Essential hypertension 07/03/2019  . Glaucoma   . Hypertension    on meds  . Hypertension goal BP (blood pressure) < 140/90 02/28/2018  . Nasal congestion 11/24/2018  . Palpitations 11/17/2018  . Refused pneumococcal vaccination 02/28/2018  . Wears hearing aid in both ears 02/28/2018    Past Surgical History:  Procedure Laterality Date  . DENTAL SURGERY  2016   implant    Current Medications: Current Meds  Medication Sig  . BESIVANCE 0.6 % SUSP   . latanoprost (XALATAN) 0.005 % ophthalmic solution 1 drop at bedtime.  Marland Kitchen PROLENSA 0.07 % SOLN Place 1 drop into the right eye daily.     Allergies:   Patient has no known allergies.   Social History   Socioeconomic History  . Marital status: Married    Spouse name: Cody Stephens  . Number of children: 3  . Years of education: 58  . Highest education level: Doctorate  Occupational History  . 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 Smoking status: Never Smoker  . Smokeless tobacco: Never Used  Substance and Sexual Activity  . Alcohol use: Yes    Comment: occasionally  . Drug use: Never  . Sexual activity: Not Currently  Other Topics Concern  . Not on file  Social History Narrative   Like to square dance and travels doing that when able   Social Determinants of Health   Financial Resource Strain: Low Risk   . Difficulty of Paying Living Expenses: Not hard at all  Food Insecurity: No Food Insecurity  . Worried About Programme researcher, broadcasting/film/video in the Last Year: Never true  . Ran Out of Food in the Last Year: Never true  Transportation Needs: No  Transportation Needs  . Lack of Transportation (Medical): No  . Lack of Transportation (Non-Medical): No  Physical Activity: Insufficiently Active  . Days of Exercise per Week: 3 days  . Minutes of Exercise per Session: 20 min  Stress: No Stress Concern Present  . Feeling of Stress : Not at all  Social Connections: Somewhat Isolated  . Frequency of Communication with Friends and Family: Once a week  . Frequency of Social Gatherings with Friends and Family: Once a week  . Attends Religious Services: Never  . Active Member of Clubs or Organizations: Yes  . Attends Banker Meetings: More than 4 times per year  . Marital Status: Married     Family History: The patient's family history includes Hypertension in his mother.  ROS:   Please see the history of present illness.    All other systems reviewed and are negative.  EKGs/Labs/Other Studies Reviewed:    The following studies were reviewed today: EKG reveals sinus rhythm and nonspecific ST-T changes and was done in the past.   Recent Labs: 07/14/2019: ALT 6; BUN 16; Creatinine, Ser 1.21; Hemoglobin 16.2; Platelets 186; Potassium 4.2; Sodium 141; TSH 4.400  Recent Lipid Panel    Component Value Date/Time   CHOL 179 07/14/2019 0811   TRIG 52 07/14/2019 0811   HDL 51 07/14/2019 0811   CHOLHDL 3.5 07/14/2019 0811   CHOLHDL 3.7 02/28/2018 1618   LDLCALC 118 (H) 07/14/2019 0811   LDLCALC 121 (H) 02/28/2018 1618    Physical Exam:    VS:  BP 140/80   Pulse 62   Ht 5\' 10"  (1.778 m)   Wt 172 lb (78 kg)   SpO2 97%   BMI 24.68 kg/m     Wt Readings from Last 3 Encounters:  12/27/19 172 lb (78 kg)  07/03/19 173 lb (78.5 kg)  05/03/19 166 lb 8 oz (75.5 kg)     GEN: Patient is in no acute distress HEENT: Normal NECK: No JVD; No carotid bruits LYMPHATICS: No lymphadenopathy CARDIAC: Hear sounds regular, 2/6 systolic murmur at the apex. RESPIRATORY:  Clear to auscultation without rales, wheezing or rhonchi   ABDOMEN: Soft, non-tender, non-distended MUSCULOSKELETAL:  No edema; No deformity  SKIN: Warm and dry NEUROLOGIC:  Alert and oriented x 3 PSYCHIATRIC:  Normal affect   Signed, 05/05/19, MD  12/27/2019 9:27 AM    [facility]

## 2019-12-31 DIAGNOSIS — H2512 Age-related nuclear cataract, left eye: Secondary | ICD-10-CM | POA: Diagnosis not present

## 2020-01-02 DIAGNOSIS — I10 Essential (primary) hypertension: Secondary | ICD-10-CM | POA: Diagnosis not present

## 2020-01-02 DIAGNOSIS — Z13220 Encounter for screening for lipoid disorders: Secondary | ICD-10-CM | POA: Diagnosis not present

## 2020-01-02 DIAGNOSIS — Z1329 Encounter for screening for other suspected endocrine disorder: Secondary | ICD-10-CM | POA: Diagnosis not present

## 2020-01-03 LAB — CBC WITH DIFFERENTIAL/PLATELET
Basophils Absolute: 0 10*3/uL (ref 0.0–0.2)
Basos: 1 %
EOS (ABSOLUTE): 0.2 10*3/uL (ref 0.0–0.4)
Eos: 4 %
Hematocrit: 49.8 % (ref 37.5–51.0)
Hemoglobin: 16.7 g/dL (ref 13.0–17.7)
Immature Grans (Abs): 0 10*3/uL (ref 0.0–0.1)
Immature Granulocytes: 0 %
Lymphocytes Absolute: 2.1 10*3/uL (ref 0.7–3.1)
Lymphs: 33 %
MCH: 30.5 pg (ref 26.6–33.0)
MCHC: 33.5 g/dL (ref 31.5–35.7)
MCV: 91 fL (ref 79–97)
Monocytes Absolute: 0.6 10*3/uL (ref 0.1–0.9)
Monocytes: 9 %
Neutrophils Absolute: 3.3 10*3/uL (ref 1.4–7.0)
Neutrophils: 53 %
Platelets: 203 10*3/uL (ref 150–450)
RBC: 5.48 x10E6/uL (ref 4.14–5.80)
RDW: 13 % (ref 11.6–15.4)
WBC: 6.2 10*3/uL (ref 3.4–10.8)

## 2020-01-03 LAB — BASIC METABOLIC PANEL
BUN/Creatinine Ratio: 12 (ref 10–24)
BUN: 14 mg/dL (ref 8–27)
CO2: 23 mmol/L (ref 20–29)
Calcium: 9.6 mg/dL (ref 8.6–10.2)
Chloride: 105 mmol/L (ref 96–106)
Creatinine, Ser: 1.15 mg/dL (ref 0.76–1.27)
GFR calc Af Amer: 71 mL/min/{1.73_m2} (ref >59)
GFR calc non Af Amer: 61 mL/min/{1.73_m2} (ref >59)
Glucose: 89 mg/dL (ref 65–99)
Potassium: 4.4 mmol/L (ref 3.5–5.2)
Sodium: 143 mmol/L (ref 134–144)

## 2020-01-03 LAB — LIPID PANEL
Chol/HDL Ratio: 3.6 ratio (ref 0.0–5.0)
Cholesterol, Total: 185 mg/dL (ref 100–199)
HDL: 51 mg/dL (ref >39)
LDL Chol Calc (NIH): 121 mg/dL — ABNORMAL HIGH (ref 0–99)
Triglycerides: 70 mg/dL (ref 0–149)
VLDL Cholesterol Cal: 13 mg/dL (ref 5–40)

## 2020-01-03 LAB — HEPATIC FUNCTION PANEL
ALT: 7 IU/L (ref 0–44)
AST: 19 IU/L (ref 0–40)
Albumin: 4.1 g/dL (ref 3.7–4.7)
Alkaline Phosphatase: 77 IU/L (ref 39–117)
Bilirubin Total: 0.6 mg/dL (ref 0.0–1.2)
Bilirubin, Direct: 0.14 mg/dL (ref 0.00–0.40)
Total Protein: 6.3 g/dL (ref 6.0–8.5)

## 2020-01-03 LAB — TSH: TSH: 4.91 u[IU]/mL — ABNORMAL HIGH (ref 0.450–4.500)

## 2020-01-04 DIAGNOSIS — H2512 Age-related nuclear cataract, left eye: Secondary | ICD-10-CM | POA: Diagnosis not present

## 2020-01-04 DIAGNOSIS — H401121 Primary open-angle glaucoma, left eye, mild stage: Secondary | ICD-10-CM | POA: Diagnosis not present

## 2020-05-22 DIAGNOSIS — H838X3 Other specified diseases of inner ear, bilateral: Secondary | ICD-10-CM | POA: Diagnosis not present

## 2020-05-22 DIAGNOSIS — H903 Sensorineural hearing loss, bilateral: Secondary | ICD-10-CM | POA: Diagnosis not present

## 2020-08-23 DIAGNOSIS — H401112 Primary open-angle glaucoma, right eye, moderate stage: Secondary | ICD-10-CM | POA: Diagnosis not present

## 2020-08-23 DIAGNOSIS — Z961 Presence of intraocular lens: Secondary | ICD-10-CM | POA: Diagnosis not present

## 2020-08-23 DIAGNOSIS — H5213 Myopia, bilateral: Secondary | ICD-10-CM | POA: Diagnosis not present

## 2020-08-23 DIAGNOSIS — H401121 Primary open-angle glaucoma, left eye, mild stage: Secondary | ICD-10-CM | POA: Diagnosis not present

## 2020-12-18 ENCOUNTER — Other Ambulatory Visit: Payer: Self-pay

## 2020-12-18 ENCOUNTER — Encounter: Payer: Self-pay | Admitting: Family Medicine

## 2020-12-18 ENCOUNTER — Ambulatory Visit (INDEPENDENT_AMBULATORY_CARE_PROVIDER_SITE_OTHER): Payer: Medicare Other | Admitting: Family Medicine

## 2020-12-18 VITALS — BP 160/80 | HR 76 | Temp 97.9°F | Ht 70.0 in | Wt 179.5 lb

## 2020-12-18 DIAGNOSIS — R7989 Other specified abnormal findings of blood chemistry: Secondary | ICD-10-CM | POA: Insufficient documentation

## 2020-12-18 DIAGNOSIS — E079 Disorder of thyroid, unspecified: Secondary | ICD-10-CM

## 2020-12-18 DIAGNOSIS — E785 Hyperlipidemia, unspecified: Secondary | ICD-10-CM

## 2020-12-18 DIAGNOSIS — I10 Essential (primary) hypertension: Secondary | ICD-10-CM | POA: Diagnosis not present

## 2020-12-18 HISTORY — DX: Other specified abnormal findings of blood chemistry: R79.89

## 2020-12-18 HISTORY — DX: Hyperlipidemia, unspecified: E78.5

## 2020-12-18 NOTE — Assessment & Plan Note (Signed)
Repeat TSH

## 2020-12-18 NOTE — Progress Notes (Signed)
Cody Stephens - 80 y.o. male MRN [id number]  Date of birth: 11-Sep-1941  Subjective No chief complaint on file.   HPI Cody Stephens is a 80 y.o. male here today for follow up visit.  He has history of HTN.  He is not on any medication for treatment of HTN.  He does monitor BP at home and readings are typically around 125/60-70.  He was taking amlodipine previously but had hypotension with this.   He sees cardiology as well.  He denies chest pain, shortness of breath, palpitations, headache or vision changes.  He and his wife do competitive square dancing for exercise.    He would like to have updated labs today.     ROS:  A comprehensive ROS was completed and negative except as noted per HPI  No Known Allergies  Past Medical History:  Diagnosis Date  . Colon cancer screening declined 02/28/2018  . Essential hypertension 07/03/2019  . Glaucoma   . Hypertension    on meds  . Hypertension goal BP (blood pressure) < 140/90 02/28/2018  . Nasal congestion 11/24/2018  . Palpitations 11/17/2018  . Refused pneumococcal vaccination 02/28/2018  . Wears hearing aid in both ears 02/28/2018    Past Surgical History:  Procedure Laterality Date  . DENTAL SURGERY  2016   implant    Social History   Socioeconomic History  . Marital status: Married    Spouse name: Bosie Clos  . Number of children: 3  . Years of education: 57  . Highest education level: Doctorate  Occupational History  . Occupation: Retired Charity fundraiser    Comment: retired  Tobacco Use  . Smoking status: Never Smoker  . Smokeless tobacco: Never Used  Vaping Use  . Vaping Use: Never used  Substance and Sexual Activity  . Alcohol use: Yes    Comment: occasionally  . Drug use: Never  . Sexual activity: Not Currently  Other Topics Concern  . Not on file  Social History Narrative   Like to square dance and travels doing that when able   Social Determinants of Health   Financial Resource Strain: Not on file  Food Insecurity: Not  on file  Transportation Needs: Not on file  Physical Activity: Not on file  Stress: Not on file  Social Connections: Not on file    Family History  Problem Relation Age of Onset  . Hypertension Mother     Health Maintenance  Topic Date Due  . Hepatitis C Screening  Never done  . TETANUS/TDAP  Never done  . COVID-19 Vaccine (3 - Booster for Pfizer series) 01/03/2021 (Originally 05/10/2020)  . INFLUENZA VACCINE  01/09/2021 (Originally 05/12/2020)  . PNA vac Low Risk Adult (1 of 2 - PCV13) 12/18/2021 (Originally 06/14/2008)  . HPV VACCINES  Aged Out     ----------------------------------------------------------------------------------------------------------------------------------------------------------------------------------------------------------------- Physical Exam BP (!) 160/80 (BP Location: Left Arm, Patient Position: Sitting, Cuff Size: Normal)   Pulse 76   Temp 97.9 F (36.6 C)   Ht 5\' 10"  (1.778 m)   Wt 179 lb 8 oz (81.4 kg)   SpO2 97%   BMI 25.76 kg/m   Physical Exam Constitutional:      Appearance: Normal appearance.  HENT:     Head: Normocephalic and atraumatic.  Eyes:     General: No scleral icterus. Cardiovascular:     Rate and Rhythm: Normal rate and regular rhythm.  Pulmonary:     Effort: Pulmonary effort is normal.     Breath sounds: Normal breath  sounds.  Musculoskeletal:     Cervical back: Neck supple.  Skin:    General: Skin is warm and dry.  Neurological:     General: No focal deficit present.     Mental Status: He is alert.  Psychiatric:        Mood and Affect: Mood normal.        Behavior: Behavior normal.     ------------------------------------------------------------------------------------------------------------------------------------------------------------------------------------------------------------------- Assessment and Plan  Essential hypertension BP elevated today, normal readings at home.  He had hypotension with  amlodipine previously.  Will hold off on starting new medication at this time and have him continue home monitoring.    Hyperlipidemia Update lipid panel   Abnormal TSH Repeat TSH.   No orders of the defined types were placed in this encounter.   No follow-ups on file.    This visit occurred during the SARS-CoV-2 public health emergency.  Safety protocols were in place, including screening questions prior to the visit, additional usage of staff PPE, and extensive cleaning of exam room while observing appropriate contact time as indicated for disinfecting solutions.

## 2020-12-18 NOTE — Assessment & Plan Note (Signed)
BP elevated today, normal readings at home.  He had hypotension with amlodipine previously.  Will hold off on starting new medication at this time and have him continue home monitoring.

## 2020-12-18 NOTE — Assessment & Plan Note (Signed)
Update lipid panel.  

## 2020-12-18 NOTE — Patient Instructions (Signed)
Continue to monitor BP at home.  Have labs completed.  Good luck and safe travels at the [facility].

## 2020-12-19 DIAGNOSIS — E785 Hyperlipidemia, unspecified: Secondary | ICD-10-CM | POA: Diagnosis not present

## 2020-12-19 DIAGNOSIS — I10 Essential (primary) hypertension: Secondary | ICD-10-CM | POA: Diagnosis not present

## 2020-12-19 DIAGNOSIS — E079 Disorder of thyroid, unspecified: Secondary | ICD-10-CM | POA: Diagnosis not present

## 2020-12-19 DIAGNOSIS — R7989 Other specified abnormal findings of blood chemistry: Secondary | ICD-10-CM | POA: Diagnosis not present

## 2020-12-20 DIAGNOSIS — H401112 Primary open-angle glaucoma, right eye, moderate stage: Secondary | ICD-10-CM | POA: Diagnosis not present

## 2020-12-20 DIAGNOSIS — H18513 Endothelial corneal dystrophy, bilateral: Secondary | ICD-10-CM | POA: Diagnosis not present

## 2020-12-20 DIAGNOSIS — H401121 Primary open-angle glaucoma, left eye, mild stage: Secondary | ICD-10-CM | POA: Diagnosis not present

## 2020-12-20 DIAGNOSIS — Z961 Presence of intraocular lens: Secondary | ICD-10-CM | POA: Diagnosis not present

## 2020-12-20 DIAGNOSIS — H04123 Dry eye syndrome of bilateral lacrimal glands: Secondary | ICD-10-CM | POA: Diagnosis not present

## 2020-12-20 LAB — CBC
HCT: 48.2 % (ref 38.5–50.0)
Hemoglobin: 16.5 g/dL (ref 13.2–17.1)
MCH: 31.4 pg (ref 27.0–33.0)
MCHC: 34.2 g/dL (ref 32.0–36.0)
MCV: 91.8 fL (ref 80.0–100.0)
MPV: 11 fL (ref 7.5–12.5)
Platelets: 224 10*3/uL (ref 140–400)
RBC: 5.25 10*6/uL (ref 4.20–5.80)
RDW: 12.7 % (ref 11.0–15.0)
WBC: 5.1 10*3/uL (ref 3.8–10.8)

## 2020-12-20 LAB — LIPID PANEL W/REFLEX DIRECT LDL
Cholesterol: 208 mg/dL — ABNORMAL HIGH (ref <200)
HDL: 52 mg/dL (ref >=40)
LDL Cholesterol (Calc): 136 mg/dL (calc) — ABNORMAL HIGH
Non-HDL Cholesterol (Calc): 156 mg/dL (calc) — ABNORMAL HIGH (ref <130)
Total CHOL/HDL Ratio: 4 (calc) (ref <5.0)
Triglycerides: 94 mg/dL (ref <150)

## 2020-12-20 LAB — HEPATIC FUNCTION PANEL
AG Ratio: 1.8 (calc) (ref 1.0–2.5)
ALT: 8 U/L — ABNORMAL LOW (ref 9–46)
AST: 15 U/L (ref 10–35)
Albumin: 4.2 g/dL (ref 3.6–5.1)
Alkaline phosphatase (APISO): 69 U/L (ref 35–144)
Bilirubin, Direct: 0.1 mg/dL (ref 0.0–0.2)
Globulin: 2.4 g/dL (calc) (ref 1.9–3.7)
Indirect Bilirubin: 0.5 mg/dL (calc) (ref 0.2–1.2)
Total Bilirubin: 0.6 mg/dL (ref 0.2–1.2)
Total Protein: 6.6 g/dL (ref 6.1–8.1)

## 2020-12-20 LAB — BASIC METABOLIC PANEL
BUN: 15 mg/dL (ref 7–25)
CO2: 26 mmol/L (ref 20–32)
Calcium: 9.5 mg/dL (ref 8.6–10.3)
Chloride: 107 mmol/L (ref 98–110)
Creat: 1.13 mg/dL (ref 0.70–1.18)
Glucose, Bld: 90 mg/dL (ref 65–99)
Potassium: 4.5 mmol/L (ref 3.5–5.3)
Sodium: 145 mmol/L (ref 135–146)

## 2020-12-20 LAB — TSH: TSH: 4.78 mIU/L — ABNORMAL HIGH (ref 0.40–4.50)

## 2020-12-23 ENCOUNTER — Encounter: Payer: Self-pay | Admitting: Family Medicine

## 2021-01-03 ENCOUNTER — Other Ambulatory Visit: Payer: Self-pay | Admitting: Family Medicine

## 2021-01-03 ENCOUNTER — Encounter: Payer: Self-pay | Admitting: Family Medicine

## 2021-01-03 ENCOUNTER — Telehealth: Payer: Self-pay

## 2021-01-03 DIAGNOSIS — Z111 Encounter for screening for respiratory tuberculosis: Secondary | ICD-10-CM

## 2021-01-03 NOTE — Telephone Encounter (Signed)
Called and advised patient that [facility] lab is required for [facility] admission. Labs were faxed to [facility]  Patient expressed understanding.

## 2021-01-06 DIAGNOSIS — Z111 Encounter for screening for respiratory tuberculosis: Secondary | ICD-10-CM | POA: Diagnosis not present

## 2021-01-06 NOTE — Telephone Encounter (Signed)
Patient advised.

## 2021-01-08 LAB — QUANTIFERON-TB GOLD PLUS
Mitogen-NIL: >10 IU/mL
NIL: 0.02 IU/mL
QuantiFERON-TB Gold Plus: NEGATIVE
TB1-NIL: 0.01 IU/mL
TB2-NIL: 0.01 IU/mL

## 2021-01-13 DIAGNOSIS — I10 Essential (primary) hypertension: Secondary | ICD-10-CM | POA: Insufficient documentation

## 2021-01-14 ENCOUNTER — Other Ambulatory Visit: Payer: Self-pay

## 2021-01-14 ENCOUNTER — Encounter: Payer: Self-pay | Admitting: Cardiology

## 2021-01-14 ENCOUNTER — Ambulatory Visit (INDEPENDENT_AMBULATORY_CARE_PROVIDER_SITE_OTHER): Payer: Medicare Other | Admitting: Cardiology

## 2021-01-14 VITALS — BP 136/72 | HR 79 | Ht 70.6 in | Wt 175.1 lb

## 2021-01-14 DIAGNOSIS — E782 Mixed hyperlipidemia: Secondary | ICD-10-CM | POA: Diagnosis not present

## 2021-01-14 DIAGNOSIS — R002 Palpitations: Secondary | ICD-10-CM | POA: Diagnosis not present

## 2021-01-14 DIAGNOSIS — I10 Essential (primary) hypertension: Secondary | ICD-10-CM

## 2021-01-14 NOTE — Progress Notes (Signed)
Cardiology Office Note:    Date:  01/14/2021   ID:  Cody Stephens, DOB 1941-01-26, MRN [id number]  PCP:  Everrett Coombe, DO  Cardiologist:  Garwin Brothers, MD   Referring MD: No ref. provider found    ASSESSMENT:    1. Essential hypertension   2. Mixed hyperlipidemia   3. Palpitations    PLAN:    In order of problems listed above:  1. Primary prevention stressed with the patient.  Importance of compliance with diet medication stressed any vocalized understanding.  Lifestyle modification was urged and I told him to continue with his aerobic activity. 2. Essential hypertension: Stable without any medications.  Lifestyle modification and diet is controlled this and normal now.  His blood pressure numbers from home are excellent. 3. Mixed dyslipidemia: Lipids were reviewed diet emphasized.  I told him that it was suggested calcium scoring for his stratification and is agreeable. 4. Patient will be seen in follow-up appointment in 6 months or earlier if the patient has any concerns    Medication Adjustments/Labs and Tests Ordered: Current medicines are reviewed at length with the patient today.  Concerns regarding medicines are outlined above.  Orders Placed This Encounter  Procedures  . CT CARDIAC SCORING (SELF PAY ONLY)  . EKG 12-Lead   No orders of the defined types were placed in this encounter.    No chief complaint on file.    History of Present Illness:    Cody Stephens is a 80 y.o. male.  Patient has past medical history of essential hypertension well controlled with lifestyle modification and hyperlipidemia.  He occasionally has palpitations.  He denies any chest pain orthopnea or PND.  He goes dancing several times a week without any problems.  At the time of my evaluation, the patient is alert awake oriented and in no distress.  Past Medical History:  Diagnosis Date  . Abnormal TSH 12/18/2020  . Colon cancer screening declined 02/28/2018  . Essential hypertension  07/03/2019  . Glaucoma   . Hyperlipidemia 12/18/2020  . Hypertension    on meds  . Hypertension goal BP (blood pressure) < 140/90 02/28/2018  . Nasal congestion 11/24/2018  . Palpitations 11/17/2018  . Refused pneumococcal vaccination 02/28/2018  . Wears hearing aid in both ears 02/28/2018    Past Surgical History:  Procedure Laterality Date  . DENTAL SURGERY  2016   implant    Current Medications: Current Meds  Medication Sig  . latanoprost (XALATAN) 0.005 % ophthalmic solution Place 1 drop into both eyes at bedtime.     Allergies:   Patient has no known allergies.   Social History   Socioeconomic History  . Marital status: Married    Spouse name: Bosie Clos  . Number of children: 3  . Years of education: 71  . Highest education level: Doctorate  Occupational History  . Occupation: Retired Charity fundraiser    Comment: retired  Tobacco Use  . Smoking status: Never Smoker  . Smokeless tobacco: Never Used  Vaping Use  . Vaping Use: Never used  Substance and Sexual Activity  . Alcohol use: Yes    Comment: occasionally  . Drug use: Never  . Sexual activity: Not Currently  Other Topics Concern  . Not on file  Social History Narrative   Like to square dance and travels doing that when able   Social Determinants of Health   Financial Resource Strain: Not on file  Food Insecurity: Not on file  Transportation Needs: Not on  file  Physical Activity: Not on file  Stress: Not on file  Social Connections: Not on file     Family History: The patient's family history includes Hypertension in his mother.  ROS:   Please see the history of present illness.    All other systems reviewed and are negative.  EKGs/Labs/Other Studies Reviewed:    The following studies were reviewed today: I discussed my findings with the patient at length.  EKG reveals sinus rhythm and nonspecific ST-T changes   Recent Labs: 12/19/2020: ALT 8; BUN 15; Creat 1.13; Hemoglobin 16.5; Platelets 224; Potassium  4.5; Sodium 145; TSH 4.78  Recent Lipid Panel    Component Value Date/Time   CHOL 208 (H) 12/19/2020 0000   CHOL 185 01/02/2020 0828   TRIG 94 12/19/2020 0000   HDL 52 12/19/2020 0000   HDL 51 01/02/2020 0828   CHOLHDL 4.0 12/19/2020 0000   LDLCALC 136 (H) 12/19/2020 0000    Physical Exam:    VS:  BP 136/72   Pulse 79   Ht 5' 10.6" (1.793 m)   Wt 175 lb 1.3 oz (79.4 kg)   SpO2 97%   BMI 24.70 kg/m     Wt Readings from Last 3 Encounters:  01/14/21 175 lb 1.3 oz (79.4 kg)  12/18/20 179 lb 8 oz (81.4 kg)  12/27/19 172 lb (78 kg)     GEN: Patient is in no acute distress HEENT: Normal NECK: No JVD; No carotid bruits LYMPHATICS: No lymphadenopathy CARDIAC: Hear sounds regular, 2/6 systolic murmur at the apex. RESPIRATORY:  Clear to auscultation without rales, wheezing or rhonchi  ABDOMEN: Soft, non-tender, non-distended MUSCULOSKELETAL:  No edema; No deformity  SKIN: Warm and dry NEUROLOGIC:  Alert and oriented x 3 PSYCHIATRIC:  Normal affect   Signed, Garwin Brothers, MD  01/14/2021 2:11 PM    [facility]

## 2021-01-14 NOTE — Patient Instructions (Signed)
Medication Instructions:  No medication changes. *If you need a refill on your cardiac medications before your next appointment, please call your pharmacy*   Lab Work: None ordered If you have labs (blood work) drawn today and your tests are completely normal, you will receive your results only by: MyChart Message (if you have MyChart) OR A paper copy in the mail If you have any lab test that is abnormal or we need to change your treatment, we will call you to review the results.   Testing/Procedures:  We will order CT coronary calcium score. It will cost $99.00 and is not covered by insurance.  Please call [phone] to schedule.   [facility]  [address]  [place], [place] [address]    Follow-Up: At [facility], you and your health needs are our priority.  As part of our continuing mission to provide you with exceptional heart care, we have created designated Provider Care Teams.  These Care Teams include your primary Cardiologist (physician) and Advanced Practice Providers (APPs -  Physician Assistants and Nurse Practitioners) who all work together to provide you with the care you need, when you need it.  We recommend signing up for the patient portal called "MyChart".  Sign up information is provided on this After Visit Summary.  MyChart is used to connect with patients for Virtual Visits (Telemedicine).  Patients are able to view lab/test results, encounter notes, upcoming appointments, etc.  Non-urgent messages can be sent to your provider as well.   To learn more about what you can do with MyChart, go to [email or web address].    Your next appointment:   6 month(s)  The format for your next appointment:   In Person  Provider:   Rajan Revankar, MD   Other Instructions  Coronary Calcium Scan A coronary calcium scan is an imaging test used to look for deposits of plaque in the inner lining of the blood vessels of the heart (coronary arteries).  Plaque is made up of calcium, protein, and fatty substances. These deposits of plaque can partly clog and narrow the coronary arteries without producing any symptoms or warning signs. This puts a person at risk for a heart attack. This test is recommended for people who are at moderate risk for heart disease. The test can find plaque deposits before symptoms develop. Tell a health care provider about: Any allergies you have. All medicines you are taking, including vitamins, herbs, eye drops, creams, and over-the-counter medicines. Any problems you or family members have had with anesthetic medicines. Any blood disorders you have. Any surgeries you have had. Any medical conditions you have. Whether you are pregnant or may be pregnant. What are the risks? Generally, this is a safe procedure. However, problems may occur, including: Harm to a pregnant woman and her unborn baby. This test involves the use of radiation. Radiation exposure can be dangerous to a pregnant woman and her unborn baby. If you are pregnant or think you may be pregnant, you should not have this procedure done. Slight increase in the risk of cancer. This is because of the radiation involved in the test. What happens before the procedure? Ask your health care provider for any specific instructions on how to prepare for this procedure. You may be asked to avoid products that contain caffeine, tobacco, or nicotine for 4 hours before the procedure. What happens during the procedure? You will undress and remove any jewelry from your neck or chest. You will put on   a hospital gown. Sticky electrodes will be placed on your chest. The electrodes will be connected to an electrocardiogram (ECG) machine to record a tracing of the electrical activity of your heart. You will lie down on a curved bed that is attached to the CT scanner. You may be given medicine to slow down your heart rate so that clear pictures can be created. You will be  moved into the CT scanner, and the CT scanner will take pictures of your heart. During this time, you will be asked to lie still and hold your breath for 2-3 seconds at a time while each picture of your heart is being taken. The procedure may vary among health care providers and hospitals.    What happens after the procedure? You can get dressed. You can return to your normal activities. It is up to you to get the results of your procedure. Ask your health care provider, or the department that is doing the procedure, when your results will be ready. Summary A coronary calcium scan is an imaging test used to look for deposits of plaque in the inner lining of the blood vessels of the heart (coronary arteries). Plaque is made up of calcium, protein, and fatty substances. Generally, this is a safe procedure. Tell your health care provider if you are pregnant or may be pregnant. Ask your health care provider for any specific instructions on how to prepare for this procedure. A CT scanner will take pictures of your heart. You can return to your normal activities after the scan is done. This information is not intended to replace advice given to you by your health care provider. Make sure you discuss any questions you have with your health care provider. Document Revised: 04/18/2019 Document Reviewed: 04/18/2019 Elsevier Patient Education  2021 [employer]  

## 2021-03-17 ENCOUNTER — Ambulatory Visit (INDEPENDENT_AMBULATORY_CARE_PROVIDER_SITE_OTHER)
Admission: RE | Admit: 2021-03-17 | Discharge: 2021-03-17 | Disposition: A | Discharge status: Home / Self Care | Payer: Self-pay | Source: Ambulatory Visit | Attending: Cardiology | Admitting: Cardiology

## 2021-03-17 ENCOUNTER — Other Ambulatory Visit: Payer: Self-pay

## 2021-03-17 DIAGNOSIS — E782 Mixed hyperlipidemia: Secondary | ICD-10-CM

## 2021-03-21 ENCOUNTER — Other Ambulatory Visit: Payer: Self-pay

## 2021-03-25 ENCOUNTER — Other Ambulatory Visit: Payer: Self-pay

## 2021-03-25 ENCOUNTER — Ambulatory Visit (INDEPENDENT_AMBULATORY_CARE_PROVIDER_SITE_OTHER): Payer: Medicare Other | Admitting: Family Medicine

## 2021-03-25 VITALS — BP 155/82 | HR 73 | Ht 70.0 in | Wt 171.1 lb

## 2021-03-25 DIAGNOSIS — Z0000 Encounter for general adult medical examination without abnormal findings: Secondary | ICD-10-CM | POA: Diagnosis not present

## 2021-03-25 NOTE — Progress Notes (Signed)
MEDICARE ANNUAL WELLNESS VISIT  03/25/2021  Subjective:  Cody Stephens is a 80 y.o. male patient of Everrett CoombeMatthews, Cody, DO who had a Medicare Annual Wellness Visit today. Cody Stephens is Retired and lives with their spouse. he has 5 children. he reports that he is socially active and does interact with friends/family regularly. he is moderately physically active and enjoys travel, square dancing, and photography.  Patient Care Team: Everrett CoombeMatthews, Cody, DO as PCP - General (Family Medicine)  Advanced Directives 03/25/2021 05/03/2019 11/02/2018  Does Patient Have a Medical Advance Directive? No No No  Would patient like information on creating a medical advance directive? No - Patient declined No - Patient declined -    Hospital Utilization Over the Past 12 Months: # of hospitalizations or ER visits: 0 # of surgeries: 1 (tooth extraction)  Review of Systems    Patient reports that his overall health is unchanged when compared to last year.  Review of Systems: History obtained from chart review and the patient  All other systems negative.  Pain Assessment Pain : No/denies pain     Current Medications & Allergies (verified) Allergies as of 03/25/2021   No Known Allergies      Medication List        Accurate as of March 25, 2021 10:19 AM. If you have any questions, ask your nurse or doctor.          latanoprost 0.005 % ophthalmic solution Commonly known as: XALATAN Place 1 drop into both eyes at bedtime.        History (reviewed): Past Medical History:  Diagnosis Date   Abnormal TSH 12/18/2020   Colon cancer screening declined 02/28/2018   Essential hypertension 07/03/2019   Glaucoma    Hyperlipidemia 12/18/2020   Hypertension    on meds   Hypertension goal BP (blood pressure) < 140/90 02/28/2018   Nasal congestion 11/24/2018   Palpitations 11/17/2018   Refused pneumococcal vaccination 02/28/2018   Wears hearing aid in both ears 02/28/2018   Past Surgical History:  Procedure  Laterality Date   DENTAL SURGERY  2016   implant   Family History  Problem Relation Age of Onset   Hypertension Mother    Social History   Socioeconomic History   Marital status: Married    Spouse name: Cody Stephens   Number of children: 3   Years of education: 16   Highest education level: Patent examinerDoctorate  Occupational History   Occupation: Retired Charity fundraiserChemist    Comment: retired  Tobacco Use   Smoking status: Never   Smokeless tobacco: Never  Building services engineerVaping Use   Vaping Use: Never used  Substance and Sexual Activity   Alcohol use: Yes    Comment: occasionally   Drug use: Never   Sexual activity: Not Currently  Other Topics Concern   Not on file  Social History Narrative   Lives with wife. Like to square dance, photography and travels doing that when able.   Social Determinants of Health   Financial Resource Strain: Low Risk    Difficulty of Paying Living Expenses: Not hard at all  Food Insecurity: No Food Insecurity   Worried About Programme researcher, broadcasting/film/videounning Out of Food in the Last Year: Never true   Ran Out of Food in the Last Year: Never true  Transportation Needs: No Transportation Needs   Lack of Transportation (Medical): No   Lack of Transportation (Non-Medical): No  Physical Activity: Sufficiently Active   Days of Exercise per Week: 3 days   Minutes of Exercise per  Session: 60 min  Stress: No Stress Concern Present   Feeling of Stress : Not at all  Social Connections: Moderately Integrated   Frequency of Communication with Friends and Family: Once a week   Frequency of Social Gatherings with Friends and Family: Twice a week   Attends Religious Services: Never   Database administrator or Organizations: Yes   Attends Engineer, structural: More than 4 times per year   Marital Status: Married    Activities of Daily Living In your present state of health, do you have any difficulty performing the following activities: 03/25/2021  Hearing? N  Comment wears hearing aids.  Vision? N   Difficulty concentrating or making decisions? N  Walking or climbing stairs? N  Dressing or bathing? N  Doing errands, shopping? N  Preparing Food and eating ? N  Using the Toilet? N  In the past six months, have you accidently leaked urine? N  Do you have problems with loss of bowel control? N  Managing your Medications? N  Managing your Finances? N  Housekeeping or managing your Housekeeping? N  Some recent data might be hidden    Patient Education/Literacy How often do you need to have someone help you when you read instructions, pamphlets, or other written materials from your doctor or pharmacy?: 1 - Never What is the last grade level you completed in school?: PhD  Exercise Current Exercise Habits: Home exercise routine, Type of exercise: walking (dancing), Time (Minutes): 60, Frequency (Times/Week): 5, Weekly Exercise (Minutes/Week): 300, Intensity: Moderate, Exercise limited by: None identified  Diet Patient reports consuming 3 meals a day and 1 snack(s) a day Patient reports that his primary diet is: Regular Patient reports that she does have regular access to food.   Depression Screen PHQ 2/9 Scores 03/25/2021 05/03/2019 11/24/2018 10/17/2018 02/28/2018 02/28/2018  PHQ - 2 Score 0 0 0 0 0 0     Fall Risk Fall Risk  03/25/2021 05/03/2019 02/28/2018  Falls in the past year? 0 0 No  Number falls in past yr: 0 - -  Injury with Fall? 0 - -  Risk for fall due to : No Fall Risks - -  Follow up Falls evaluation completed Falls prevention discussed -     Objective:   BP (!) 155/82 (BP Location: Right Arm, Patient Position: Sitting, Cuff Size: Normal)   Pulse 73   Ht 5\' 10"  (1.778 m)   Wt 171 lb 1.9 oz (77.6 kg)   SpO2 97%   BMI 24.55 kg/m   Last Weight  Most recent update: 03/25/2021 10:14 AM    Weight  77.6 kg (171 lb 1.9 oz)             Body mass index is 24.55 kg/m.  Hearing/Vision  Tag did not have difficulty with hearing/understanding during the face-to-face  interview Cody Stephens did not have difficulty with his vision during the face-to-face interview Reports that he has had a formal eye exam by an eye care professional within the past year Reports that he has had a formal hearing evaluation within the past year  Cognitive Function: 6CIT Screen 03/25/2021 05/03/2019  What Year? 0 points 0 points  What month? 0 points 0 points  What time? 0 points 0 points  Count back from 20 0 points 0 points  Months in reverse 0 points 0 points  Repeat phrase 0 points 0 points  Total Score 0 0    Normal Cognitive Function Screening: Yes (Normal:0-7, Significant  for Dysfunction: >8)  Immunization & Health Maintenance Record Immunization History  Administered Date(s) Administered   Fluad Quad(high Dose 65+) 09/04/2019   PFIZER(Purple Top)SARS-COV-2 Vaccination 10/23/2019, 11/11/2019    Health Maintenance  Topic Date Due   COVID-19 Vaccine (3 - Booster for Pfizer series) 04/10/2021 (Originally 04/10/2020)   Zoster Vaccines- Shingrix (1 of 2) 06/25/2021 (Originally 06/14/1993)   PNA vac Low Risk Adult (1 of 2 - PCV13) 12/18/2021 (Originally 06/14/2008)   TETANUS/TDAP  03/25/2022 (Originally 06/14/1962)   Hepatitis C Screening  03/25/2022 (Originally 06/14/1959)   INFLUENZA VACCINE  05/12/2021   HPV VACCINES  Aged Out       Assessment  This is a routine wellness examination for Cody Dom.  Health Maintenance: Due or Overdue There are no preventive care reminders to display for this patient.   Cody Dom does not need a referral for Community Assistance: Care Management:   no Social Work:    no Prescription Assistance:  no Nutrition/Diabetes Education:  no   Plan:  Personalized Goals  Goals Addressed               This Visit's Progress     Patient Stated (pt-stated)        03/25/2021 AWV Goal: Exercise for General Health  Patient will verbalize understanding of the benefits of increased physical activity: Exercising regularly is  important. It will improve your overall fitness, flexibility, and endurance. Regular exercise also will improve your overall health. It can help you control your weight, reduce stress, and improve your bone density. Over the next year, patient will increase physical activity as tolerated with a goal of at least 150 minutes of moderate physical activity per week.  You can tell that you are exercising at a moderate intensity if your heart starts beating faster and you start breathing faster but can still hold a conversation. Moderate-intensity exercise ideas include: Walking 1 mile (1.6 km) in about 15 minutes Biking Hiking Golfing Dancing Water aerobics Patient will verbalize understanding of everyday activities that increase physical activity by providing examples like the following: Yard work, such as: Insurance underwriter Gardening Washing windows or floors Patient will be able to explain general safety guidelines for exercising:  Before you start a new exercise program, talk with your health care provider. Do not exercise so much that you hurt yourself, feel dizzy, or get very short of breath. Wear comfortable clothes and wear shoes with good support. Drink plenty of water while you exercise to prevent dehydration or heat stroke. Work out until your breathing and your heartbeat get faster.         Personalized Health Maintenance & Screening Recommendations  Pneumococcal vaccine  Influenza vaccine Td vaccine Shingrix vaccine  Patient declined the vaccines at this time.   Lung Cancer Screening Recommended: no (Low Dose CT Chest recommended if Age 54-80 years, 30 pack-year currently smoking OR have quit w/in past 15 years) Hepatitis C Screening recommended: yes HIV Screening recommended: yes  Advanced Directives: Written information was not given per the patient's request.  Referrals &  Orders No orders of the defined types were placed in this encounter.   Follow-up Plan Follow-up with Everrett Coombe, DO as planned Medicare wellness visit in one year. Patient stated he will access his AVS on mychart.   I have personally reviewed and noted the following in the patient's chart:   Medical and social history Use  of alcohol, tobacco or illicit drugs  Current medications and supplements Functional ability and status Nutritional status Physical activity Advanced directives List of other physicians Hospitalizations, surgeries, and ER visits in previous 12 months Vitals Screenings to include cognitive, depression, and falls Referrals and appointments  In addition, I have reviewed and discussed with patient certain preventive protocols, quality metrics, and best practice recommendations. A written personalized care plan for preventive services as well as general preventive health recommendations were provided to patient.     Modesto Charon, RN  03/25/2021

## 2021-03-25 NOTE — Patient Instructions (Addendum)
MEDICARE ANNUAL WELLNESS VISIT Health Maintenance Summary and Written Plan of Care  Cody Stephens ,  Thank you for allowing me to perform your Medicare Annual Wellness Visit and for your ongoing commitment to your health.   Health Maintenance & Immunization History Health Maintenance  Topic Date Due   COVID-19 Vaccine (3 - Booster for Pfizer series) 04/10/2021 (Originally 04/10/2020)   Zoster Vaccines- Shingrix (1 of 2) 06/25/2021 (Originally 06/14/1993)   PNA vac Low Risk Adult (1 of 2 - PCV13) 12/18/2021 (Originally 06/14/2008)   TETANUS/TDAP  03/25/2022 (Originally 06/14/1962)   Hepatitis C Screening  03/25/2022 (Originally [birth year]/14/1959)   INFLUENZA VACCINE  05/12/2021   HPV VACCINES  Aged Out   Immunization History  Administered Date(s) Administered   Fluad Quad(high Dose 65+) 09/04/2019   PFIZER(Purple Top)SARS-COV-2 Vaccination 10/23/2019, 11/11/2019    These are the patient goals that we discussed:  Goals Addressed               This Visit's Progress     Patient Stated (pt-stated)        03/25/2021 AWV Goal: Exercise for General Health  Patient will verbalize understanding of the benefits of increased physical activity: Exercising regularly is important. It will improve your overall fitness, flexibility, and endurance. Regular exercise also will improve your overall health. It can help you control your weight, reduce stress, and improve your bone density. Over the next year, patient will increase physical activity as tolerated with a goal of at least 150 minutes of moderate physical activity per week.  You can tell that you are exercising at a moderate intensity if your heart starts beating faster and you start breathing faster but can still hold a conversation. Moderate-intensity exercise ideas include: Walking 1 mile (1.6 km) in about 15 minutes Biking Hiking Golfing Dancing Water aerobics Patient will verbalize understanding of everyday activities that increase physical  activity by providing examples like the following: Yard work, such as: Insurance underwriter Gardening Washing windows or floors Patient will be able to explain general safety guidelines for exercising:  Before you start a new exercise program, talk with your health care provider. Do not exercise so much that you hurt yourself, feel dizzy, or get very short of breath. Wear comfortable clothes and wear shoes with good support. Drink plenty of water while you exercise to prevent dehydration or heat stroke. Work out until your breathing and your heartbeat get faster.           This is a list of Health Maintenance Items that are overdue or due now: There are no preventive care reminders to display for this patient.   Orders/Referrals Placed Today: No orders of the defined types were placed in this encounter.  (Contact our referral department at [phone] if you have not spoken with someone about your referral appointment within the next 5 days)    Follow-up Plan Follow-up with Everrett Coombe, DO as planned Medicare wellness visit in one year. Patient stated he will access his AVS on mychart.        Health Maintenance, Male Adopting a healthy lifestyle and getting preventive care are important in promoting health and wellness. Ask your health care provider about: The right schedule for you to have regular tests and exams. Things you can do on your own to prevent diseases and keep yourself healthy. What should I know about diet, weight, and exercise? Eat a healthy diet  Eat a diet that includes plenty of vegetables, fruits, low-fat dairy products, and lean protein. Do not eat a lot of foods that are high in solid fats, added sugars, or sodium.  Maintain a healthy weight Body mass index (BMI) is a measurement that can be used to identify possible weight problems. It estimates body fat based on  height and weight. Your health care provider can help determine your BMI and help you achieve or maintain ahealthy weight. Get regular exercise Get regular exercise. This is one of the most important things you can do for your health. Most adults should: Exercise for at least 150 minutes each week. The exercise should increase your heart rate and make you sweat (moderate-intensity exercise). Do strengthening exercises at least twice a week. This is in addition to the moderate-intensity exercise. Spend less time sitting. Even light physical activity can be beneficial. Watch cholesterol and blood lipids Have your blood tested for lipids and cholesterol at 78 years of age, then havethis test every 5 years. You may need to have your cholesterol levels checked more often if: Your lipid or cholesterol levels are high. You are older than 78 years of age. You are at high risk for heart disease. What should I know about cancer screening? Many types of cancers can be detected early and may often be prevented. Depending on your health history and family history, you may need to have cancer screening at various ages. This may include screening for: Colorectal cancer. Prostate cancer. Skin cancer. Lung cancer. What should I know about heart disease, diabetes, and high blood pressure? Blood pressure and heart disease High blood pressure causes heart disease and increases the risk of stroke. This is more likely to develop in people who have high blood pressure readings, are of African descent, or are overweight. Talk with your health care provider about your target blood pressure readings. Have your blood pressure checked: Every 3-5 years if you are 52-26 years of age. Every year if you are 33 years old or older. If you are between the ages of 60 and 48 and are a current or former smoker, ask your health care provider if you should have a one-time screening for abdominal aortic aneurysm  (AAA). Diabetes Have regular diabetes screenings. This checks your fasting blood sugar level. Have the screening done: Once every three years after age 85 if you are at a normal weight and have a low risk for diabetes. More often and at a younger age if you are overweight or have a high risk for diabetes. What should I know about preventing infection? Hepatitis B If you have a higher risk for hepatitis B, you should be screened for this virus. Talk with your health care provider to find out if you are at risk forhepatitis B infection. Hepatitis C Blood testing is recommended for: Everyone born from [birth year] through 1963. Anyone with known risk factors for hepatitis C. Sexually transmitted infections (STIs) You should be screened each year for STIs, including gonorrhea and chlamydia, if: You are sexually active and are younger than 78 years of age. You are older than 78 years of age and your health care provider tells you that you are at risk for this type of infection. Your sexual activity has changed since you were last screened, and you are at increased risk for chlamydia or gonorrhea. Ask your health care provider if you are at risk. Ask your health care provider about whether you are at high risk for HIV. Your  health care provider may recommend a prescription medicine to help prevent HIV infection. If you choose to take medicine to prevent HIV, you should first get tested for HIV. You should then be tested every 3 months for as long as you are taking the medicine. Follow these instructions at home: Lifestyle Do not use any products that contain nicotine or tobacco, such as cigarettes, e-cigarettes, and chewing tobacco. If you need help quitting, ask your health care provider. Do not use street drugs. Do not share needles. Ask your health care provider for help if you need support or information about quitting drugs. Alcohol use Do not drink alcohol if your health care provider tells you not  to drink. If you drink alcohol: Limit how much you have to 0-2 drinks a day. Be aware of how much alcohol is in your drink. In the [place] one drink equals one 12 oz bottle of beer (355 mL), one 5 oz glass of wine (148 mL), or one 1 oz glass of hard liquor (44 mL). General instructions Schedule regular health, dental, and eye exams. Stay current with your vaccines. Tell your health care provider if: You often feel depressed. You have ever been abused or do not feel safe at home. Summary Adopting a healthy lifestyle and getting preventive care are important in promoting health and wellness. Follow your health care provider's instructions about healthy diet, exercising, and getting tested or screened for diseases. Follow your health care provider's instructions on monitoring your cholesterol and blood pressure. This information is not intended to replace advice given to you by your health care provider. Make sure you discuss any questions you have with your healthcare provider. Document Revised: 09/21/2018 Document Reviewed: 09/21/2018 Elsevier Patient Education  2022 [employer].

## 2021-04-21 DIAGNOSIS — H18513 Endothelial corneal dystrophy, bilateral: Secondary | ICD-10-CM | POA: Diagnosis not present

## 2021-04-21 DIAGNOSIS — H401121 Primary open-angle glaucoma, left eye, mild stage: Secondary | ICD-10-CM | POA: Diagnosis not present

## 2021-04-21 DIAGNOSIS — Z961 Presence of intraocular lens: Secondary | ICD-10-CM | POA: Diagnosis not present

## 2021-04-21 DIAGNOSIS — H401112 Primary open-angle glaucoma, right eye, moderate stage: Secondary | ICD-10-CM | POA: Diagnosis not present

## 2021-04-21 DIAGNOSIS — H04123 Dry eye syndrome of bilateral lacrimal glands: Secondary | ICD-10-CM | POA: Diagnosis not present

## 2021-09-24 DIAGNOSIS — H401121 Primary open-angle glaucoma, left eye, mild stage: Secondary | ICD-10-CM | POA: Diagnosis not present

## 2021-09-24 DIAGNOSIS — H401112 Primary open-angle glaucoma, right eye, moderate stage: Secondary | ICD-10-CM | POA: Diagnosis not present

## 2021-09-24 DIAGNOSIS — H04123 Dry eye syndrome of bilateral lacrimal glands: Secondary | ICD-10-CM | POA: Diagnosis not present

## 2021-09-24 DIAGNOSIS — Z961 Presence of intraocular lens: Secondary | ICD-10-CM | POA: Diagnosis not present

## 2021-09-24 DIAGNOSIS — H18513 Endothelial corneal dystrophy, bilateral: Secondary | ICD-10-CM | POA: Diagnosis not present

## 2021-09-30 ENCOUNTER — Telehealth: Payer: Self-pay

## 2021-09-30 MED ORDER — NIRMATRELVIR/RITONAVIR (PAXLOVID)TABLET: 3.0000 | ORAL_TABLET | Freq: Two times a day (BID) | ORAL | 0 refills | Status: AC

## 2021-09-30 NOTE — Telephone Encounter (Signed)
Rx for paxlovid sent

## 2021-09-30 NOTE — Telephone Encounter (Signed)
Pt is aware of Rx.   Temp: 97.4

## 2021-09-30 NOTE — Telephone Encounter (Signed)
Pt lvm stating he is COVID +.  Dx @ [facility]  Symptoms started: Sunday, 09/28/21.  Current symptoms: Fever 99.0 (am), aches, nasal congestion  Taking: Tylenol

## 2022-01-09 DIAGNOSIS — Z20822 Contact with and (suspected) exposure to covid-19: Secondary | ICD-10-CM | POA: Diagnosis not present

## 2022-01-26 DIAGNOSIS — Z20822 Contact with and (suspected) exposure to covid-19: Secondary | ICD-10-CM | POA: Diagnosis not present

## 2022-01-27 DIAGNOSIS — H401121 Primary open-angle glaucoma, left eye, mild stage: Secondary | ICD-10-CM | POA: Diagnosis not present

## 2022-01-27 DIAGNOSIS — H04123 Dry eye syndrome of bilateral lacrimal glands: Secondary | ICD-10-CM | POA: Diagnosis not present

## 2022-01-27 DIAGNOSIS — H18513 Endothelial corneal dystrophy, bilateral: Secondary | ICD-10-CM | POA: Diagnosis not present

## 2022-01-27 DIAGNOSIS — H401112 Primary open-angle glaucoma, right eye, moderate stage: Secondary | ICD-10-CM | POA: Diagnosis not present

## 2022-01-27 DIAGNOSIS — Z961 Presence of intraocular lens: Secondary | ICD-10-CM | POA: Diagnosis not present

## 2022-02-16 DIAGNOSIS — Z20822 Contact with and (suspected) exposure to covid-19: Secondary | ICD-10-CM | POA: Diagnosis not present

## 2022-03-04 ENCOUNTER — Ambulatory Visit (INDEPENDENT_AMBULATORY_CARE_PROVIDER_SITE_OTHER): Payer: Medicare Other | Admitting: Sports Medicine

## 2022-03-04 ENCOUNTER — Ambulatory Visit (INDEPENDENT_AMBULATORY_CARE_PROVIDER_SITE_OTHER): Payer: Medicare Other

## 2022-03-04 DIAGNOSIS — M7042 Prepatellar bursitis, left knee: Secondary | ICD-10-CM

## 2022-03-04 DIAGNOSIS — Q741 Congenital malformation of knee: Secondary | ICD-10-CM | POA: Diagnosis not present

## 2022-03-04 DIAGNOSIS — Z09 Encounter for follow-up examination after completed treatment for conditions other than malignant neoplasm: Secondary | ICD-10-CM

## 2022-03-04 DIAGNOSIS — M7989 Other specified soft tissue disorders: Secondary | ICD-10-CM | POA: Diagnosis not present

## 2022-03-04 HISTORY — DX: Prepatellar bursitis, left knee: M70.42

## 2022-03-04 MED ORDER — MELOXICAM 15 MG PO TABS: ORAL_TABLET | ORAL | 3 refills | Status: DC

## 2022-03-04 NOTE — Progress Notes (Signed)
    Procedures performed today:    None.  Independent interpretation of notes and tests performed by another provider:   None.  Brief History, Exam, Impression, and Recommendations:    Prepatellar bursitis, left knee Cody Stephens 81 year old male, knelt on his left knee, subsequently he developed swelling anterior knee. Minimal discomfort initially but overall not feeling bad right now. He does desire more of a conservative approach so we will do meloxicam, he can do icing and other conservative treatments. I would like some baseline x-rays as he does likely have some underlying arthritis as well. Return to see me in 6 weeks for aspiration and injection if insufficiently improved. We did explain the pathophysiology behind bony prominence bursitides.    ___________________________________________ Ihor Austin. Benjamin Stain, M.D., ABFM., CAQSM. Primary Care and Sports Medicine [facility]  Adjunct Instructor of Family Medicine  [employer]

## 2022-03-04 NOTE — Assessment & Plan Note (Signed)
Pleasant 81 year old male, knelt on his left knee, subsequently he developed swelling anterior knee. Minimal discomfort initially but overall not feeling bad right now. He does desire more of a conservative approach so we will do meloxicam, he can do icing and other conservative treatments. I would like some baseline x-rays as he does likely have some underlying arthritis as well. Return to see me in 6 weeks for aspiration and injection if insufficiently improved. We did explain the pathophysiology behind bony prominence bursitides.

## 2022-04-16 ENCOUNTER — Ambulatory Visit: Payer: Medicare Other | Admitting: Cardiology

## 2022-04-17 ENCOUNTER — Ambulatory Visit (INDEPENDENT_AMBULATORY_CARE_PROVIDER_SITE_OTHER): Payer: Medicare Other | Admitting: Sports Medicine

## 2022-04-17 DIAGNOSIS — M7042 Prepatellar bursitis, left knee: Secondary | ICD-10-CM

## 2022-04-17 NOTE — Progress Notes (Signed)
    Procedures performed today:    None.  Independent interpretation of notes and tests performed by another provider:   None.  Brief History, Exam, Impression, and Recommendations:    Prepatellar bursitis, left knee This is a very pleasant 81 year old male, we treated him at the last visit for left-sided prepatellar bursitis after kneeling on his knee to do some work. We treated him conservatively with meloxicam, he has improved considerably, very little swelling is left, no tenderness, good motion, good strength, return as needed    ____________________________________________ Ihor Austin. Benjamin Stain, M.D., ABFM., CAQSM., AME. Primary Care and Sports Medicine [facility]  Adjunct Professor of Family Medicine  [employer]  Restaurant manager, fast food

## 2022-04-17 NOTE — Assessment & Plan Note (Signed)
This is a very pleasant 81 year old male, we treated him at the last visit for left-sided prepatellar bursitis after kneeling on his knee to do some work. We treated him conservatively with meloxicam, he has improved considerably, very little swelling is left, no tenderness, good motion, good strength, return as needed

## 2022-05-12 ENCOUNTER — Ambulatory Visit: Payer: Medicare Other | Admitting: Cardiology

## 2022-05-13 ENCOUNTER — Ambulatory Visit: Payer: Medicare Other | Admitting: Cardiology

## 2022-05-25 ENCOUNTER — Ambulatory Visit (INDEPENDENT_AMBULATORY_CARE_PROVIDER_SITE_OTHER): Payer: Medicare Other | Admitting: Family Medicine

## 2022-05-25 DIAGNOSIS — Z0000 Encounter for general adult medical examination without abnormal findings: Secondary | ICD-10-CM

## 2022-05-25 NOTE — Patient Instructions (Addendum)
MEDICARE ANNUAL WELLNESS VISIT Health Maintenance Summary and Written Plan of Care  [name] ,  Thank you for allowing me to perform your Medicare Annual Wellness Visit and for your ongoing commitment to your health.   Health Maintenance & Immunization History Health Maintenance  Topic Date Due   COVID-19 Vaccine (3 - Pfizer series) 06/10/2022 (Originally 01/06/2020)   Zoster Vaccines- Shingrix (1 of 2) 08/25/2022 (Originally 06/14/1993)   INFLUENZA VACCINE  01/10/2023 (Originally 05/12/2022)   Pneumonia Vaccine 32+ Years old (1 - PCV) 05/26/2023 (Originally 06/14/2008)   TETANUS/TDAP  05/26/2023 (Originally 06/14/1962)   Hepatitis C Screening  05/26/2023 (Originally 06/14/1959)   HPV VACCINES  Aged Out   Immunization History  Administered Date(s) Administered   Fluad Quad(high Dose 65+) 09/04/2019   PFIZER(Purple Top)SARS-COV-2 Vaccination 10/23/2019, 11/11/2019    These are the patient goals that we discussed:  Goals Addressed               This Visit's Progress     Patient Stated (pt-stated)        Patient would like to continue to be stay healthy.         This is a list of Health Maintenance Items that are overdue or due now: Pneumococcal vaccine  Influenza vaccine Td vaccine Screening electrocardiogram Shingrix vaccine  Patient declined the vaccines at this time.   Orders/Referrals Placed Today: No orders of the defined types were placed in this encounter.  (Contact our referral department at [phone] if you have not spoken with someone about your referral appointment within the next 5 days)    Follow-up Plan Follow-up with Everrett Coombe, DO as planned Medicare wellness visit in one year. Patient will access AVS on my chart.      Health Maintenance, Male Adopting a healthy lifestyle and getting preventive care are important in promoting health and wellness. Ask your health care provider about: The right schedule for you to have regular tests and  exams. Things you can do on your own to prevent diseases and keep yourself healthy. What should I know about diet, weight, and exercise? Eat a healthy diet  Eat a diet that includes plenty of vegetables, fruits, low-fat dairy products, and lean protein. Do not eat a lot of foods that are high in solid fats, added sugars, or sodium. Maintain a healthy weight Body mass index (BMI) is a measurement that can be used to identify possible weight problems. It estimates body fat based on height and weight. Your health care provider can help determine your BMI and help you achieve or maintain a healthy weight. Get regular exercise Get regular exercise. This is one of the most important things you can do for your health. Most adults should: Exercise for at least 150 minutes each week. The exercise should increase your heart rate and make you sweat (moderate-intensity exercise). Do strengthening exercises at least twice a week. This is in addition to the moderate-intensity exercise. Spend less time sitting. Even light physical activity can be beneficial. Watch cholesterol and blood lipids Have your blood tested for lipids and cholesterol at 79 years of age, then have this test every 5 years. You may need to have your cholesterol levels checked more often if: Your lipid or cholesterol levels are high. You are older than 79 years of age. You are at high risk for heart disease. What should I know about cancer screening? Many types of cancers can be detected early and may often be prevented. Depending on your health  history and family history, you may need to have cancer screening at various ages. This may include screening for: Colorectal cancer. Prostate cancer. Skin cancer. Lung cancer. What should I know about heart disease, diabetes, and high blood pressure? Blood pressure and heart disease High blood pressure causes heart disease and increases the risk of stroke. This is more likely to develop in  people who have high blood pressure readings or are overweight. Talk with your health care provider about your target blood pressure readings. Have your blood pressure checked: Every 3-5 years if you are 60-25 years of age. Every year if you are 77 years old or older. If you are between the ages of 63 and 72 and are a current or former smoker, ask your health care provider if you should have a one-time screening for abdominal aortic aneurysm (AAA). Diabetes Have regular diabetes screenings. This checks your fasting blood sugar level. Have the screening done: Once every three years after age 66 if you are at a normal weight and have a low risk for diabetes. More often and at a younger age if you are overweight or have a high risk for diabetes. What should I know about preventing infection? Hepatitis B If you have a higher risk for hepatitis B, you should be screened for this virus. Talk with your health care provider to find out if you are at risk for hepatitis B infection. Hepatitis C Blood testing is recommended for: Everyone born from [birth year] through 1963. Anyone with known risk factors for hepatitis C. Sexually transmitted infections (STIs) You should be screened each year for STIs, including gonorrhea and chlamydia, if: You are sexually active and are younger than 79 years of age. You are older than 79 years of age and your health care provider tells you that you are at risk for this type of infection. Your sexual activity has changed since you were last screened, and you are at increased risk for chlamydia or gonorrhea. Ask your health care provider if you are at risk. Ask your health care provider about whether you are at high risk for HIV. Your health care provider may recommend a prescription medicine to help prevent HIV infection. If you choose to take medicine to prevent HIV, you should first get tested for HIV. You should then be tested every 3 months for as long as you are taking the  medicine. Follow these instructions at home: Alcohol use Do not drink alcohol if your health care provider tells you not to drink. If you drink alcohol: Limit how much you have to 0-2 drinks a day. Know how much alcohol is in your drink. In the [place] one drink equals one 12 oz bottle of beer (355 mL), one 5 oz glass of wine (148 mL), or one 1 oz glass of hard liquor (44 mL). Lifestyle Do not use any products that contain nicotine or tobacco. These products include cigarettes, chewing tobacco, and vaping devices, such as e-cigarettes. If you need help quitting, ask your health care provider. Do not use street drugs. Do not share needles. Ask your health care provider for help if you need support or information about quitting drugs. General instructions Schedule regular health, dental, and eye exams. Stay current with your vaccines. Tell your health care provider if: You often feel depressed. You have ever been abused or do not feel safe at home. Summary Adopting a healthy lifestyle and getting preventive care are important in promoting health and wellness. Follow your health care provider's  instructions about healthy diet, exercising, and getting tested or screened for diseases. Follow your health care provider's instructions on monitoring your cholesterol and blood pressure. This information is not intended to replace advice given to you by your health care provider. Make sure you discuss any questions you have with your health care provider. Document Revised: 02/17/2021 Document Reviewed: 02/17/2021 Elsevier Patient Education  [place].

## 2022-05-25 NOTE — Progress Notes (Signed)
MEDICARE ANNUAL WELLNESS VISIT  05/25/2022  Telephone Visit Disclaimer This Medicare AWV was conducted by telephone due to national recommendations for restrictions regarding the COVID-19 Pandemic (e.g. social distancing).  I verified, using two identifiers, that I am speaking with Cody Stephens or their authorized healthcare agent. I discussed the limitations, risks, security, and privacy concerns of performing an evaluation and management service by telephone and the potential availability of an in-person appointment in the future. The patient expressed understanding and agreed to proceed.  Location of Patient: Home Location of Provider (nurse):  In the office.  Subjective:    Cody Stephens is a 81 y.o. male patient of Cody Coombe, DO who had a Medicare Annual Wellness Visit today via telephone. Cody Stephens is Retired and lives with their spouse at [facility]. he has 3 children. he reports that he is socially active and does interact with friends/family regularly. he is moderately physically active and enjoys to square dance, photography and travels doing that when able.  Patient Care Team: Cody Coombe, DO as PCP - General (Family Medicine)     05/25/2022    2:04 PM 03/25/2021   10:03 AM 05/03/2019   11:08 AM 11/02/2018    8:25 PM  Advanced Directives  Does Patient Have a Medical Advance Directive? Yes No No No  Type of Advance Directive Out of facility DNR (pink MOST or yellow form)     Does patient want to make changes to medical advance directive? No - Patient declined     Would patient like information on creating a medical advance directive?  No - Patient declined No - Patient declined     Hospital Utilization Over the Past 12 Months: # of hospitalizations or ER visits: 0 # of surgeries: 0  Review of Systems    Patient reports that his overall health is unchanged compared to last year.  History obtained from chart review and the patient  Patient  Reported Readings (BP, Pulse, CBG, Weight, etc) none  Pain Assessment Pain : No/denies pain     Current Medications & Allergies (verified) Allergies as of 05/25/2022   No Known Allergies      Medication List        Accurate as of May 25, 2022  2:12 PM. If you have any questions, ask your nurse or doctor.          latanoprost 0.005 % ophthalmic solution Commonly known as: XALATAN Place 1 drop into both eyes at bedtime.        History (reviewed): Past Medical History:  Diagnosis Date   Abnormal TSH 12/18/2020   Colon cancer screening declined 02/28/2018   Essential hypertension 07/03/2019   Glaucoma    Hyperlipidemia 12/18/2020   Hypertension    on meds   Hypertension goal BP (blood pressure) < 140/90 02/28/2018   Nasal congestion 11/24/2018   Palpitations 11/17/2018   Refused pneumococcal vaccination 02/28/2018   Wears hearing aid in both ears 02/28/2018   Past Surgical History:  Procedure Laterality Date   DENTAL SURGERY  2016   implant   EYE SURGERY  Feb 2021   Cataract surgery   Family History  Problem Relation Age of Onset   Hypertension Mother    Cancer Father    Hearing loss Father    Social History   Socioeconomic History   Marital status: Married    Spouse name: Cody Stephens   Number of children: 3   Years of education: 16   Highest  education level: Doctorate  Occupational History   Occupation: Retired Charity fundraiser    Comment: retired  Tobacco Use   Smoking status: Never   Smokeless tobacco: Never  Building services engineer Use: Never used  Substance and Sexual Activity   Alcohol use: Yes    Alcohol/week: 1.0 standard drink of alcohol    Types: 1 Cans of beer per week    Comment: occasionally   Drug use: Never   Sexual activity: Not Currently  Other Topics Concern   Not on file  Social History Narrative   Lives with wife. Like to square dance, photography and travels doing that when able.   Social Determinants of Health   Financial  Resource Strain: Low Risk  (05/21/2022)   Overall Financial Resource Strain (CARDIA)    Difficulty of Paying Living Expenses: Not hard at all  Food Insecurity: No Food Insecurity (05/21/2022)   Hunger Vital Sign    Worried About Running Out of Food in the Last Year: Never true    Ran Out of Food in the Last Year: Never true  Transportation Needs: No Transportation Needs (05/21/2022)   PRAPARE - Administrator, Civil Service (Medical): No    Lack of Transportation (Non-Medical): No  Physical Activity: Sufficiently Active (05/21/2022)   Exercise Vital Sign    Days of Exercise per Week: 6 days    Minutes of Exercise per Session: 40 min  Stress: No Stress Concern Present (05/21/2022)   [employer] of Occupational Health - Occupational Stress Questionnaire    Feeling of Stress : Not at all  Social Connections: Socially Integrated (05/25/2022)   Social Connection and Isolation Panel [NHANES]    Frequency of Communication with Friends and Family: Once a week    Frequency of Social Gatherings with Friends and Family: More than three times a week    Attends Religious Services: More than 4 times per year    Active Member of [employer] or Organizations: Yes    Attends Banker Meetings: More than 4 times per year    Marital Status: Married    Activities of Daily Living    05/25/2022    2:05 PM 05/21/2022   11:48 AM  In your present state of health, do you have any difficulty performing the following activities:  Hearing? 1 1  Comment wears bilateral hearing aids.   Vision?  0  Difficulty concentrating or making decisions?  0  Walking or climbing stairs?  0  Dressing or bathing?  0  Doing errands, shopping?  0  Preparing Food and eating ?  N  Using the Toilet?  N  In the past six months, have you accidently leaked urine?  N  Do you have problems with loss of bowel control?  N  Managing your Medications?  N  Managing your Finances?  N  Housekeeping or managing your  Housekeeping?  N    Patient Education/ Literacy How often do you need to have someone help you when you read instructions, pamphlets, or other written materials from your doctor or pharmacy?: 1 - Never What is the last grade level you completed in school?: PhD  Exercise Current Exercise Habits: Structured exercise class, Type of exercise: strength training/weights;stretching;Other - see comments (balancing exercises at [facility]), Time (Minutes): 40, Frequency (Times/Week): 6, Weekly Exercise (Minutes/Week): 240, Intensity: Moderate, Exercise limited by: None identified  Diet Patient reports consuming 3 meals a day and 1 snack(s) a day Patient reports that his primary diet  is: Regular Patient reports that she does have regular access to food.   Depression Screen    05/25/2022    2:07 PM 03/25/2021   10:05 AM 05/03/2019   11:09 AM 11/24/2018    1:19 PM 10/17/2018    9:46 AM 02/28/2018    3:37 PM 02/28/2018    3:23 PM  PHQ 2/9 Scores  PHQ - 2 Score 0 0 0 0 0 0 0     Fall Risk    05/25/2022    2:06 PM 05/21/2022   11:48 AM 03/25/2021   10:04 AM 05/03/2019   11:09 AM 02/28/2018    3:37 PM  Fall Risk   Falls in the past year? 0 0 0 0 No  Number falls in past yr: 0 0 0    Injury with Fall? 0 0 0    Risk for fall due to : No Fall Risks  No Fall Risks    Follow up Falls evaluation completed  Falls evaluation completed Falls prevention discussed      Objective:  KONOR NOREN seemed alert and oriented and he participated appropriately during our telephone visit.  Blood Pressure Weight BMI  BP Readings from Last 3 Encounters:  03/25/21 (!) 155/82  01/14/21 136/72  12/18/20 (!) 160/80   Wt Readings from Last 3 Encounters:  03/25/21 171 lb 1.9 oz (77.6 kg)  01/14/21 175 lb 1.3 oz (79.4 kg)  12/18/20 179 lb 8 oz (81.4 kg)   BMI Readings from Last 1 Encounters:  03/25/21 24.55 kg/m    *Unable to obtain current vital signs, weight, and BMI due to telephone visit  type  Hearing/Vision  Lavar did not seem to have difficulty with hearing/understanding during the telephone conversation Reports that he has had a formal eye exam by an eye care professional within the past year Reports that he has not had a formal hearing evaluation within the past year *Unable to fully assess hearing and vision during telephone visit type  Cognitive Function:    05/25/2022    2:10 PM 03/25/2021   10:10 AM 05/03/2019   11:11 AM  6CIT Screen  What Year? 0 points 0 points 0 points  What month? 0 points 0 points 0 points  What time? 0 points 0 points 0 points  Count back from 20 0 points 0 points 0 points  Months in reverse 0 points 0 points 0 points  Repeat phrase 2 points 0 points 0 points  Total Score 2 points 0 points 0 points   (Normal:0-7, Significant for Dysfunction: >8)  Normal Cognitive Function Screening: Yes   Immunization & Health Maintenance Record Immunization History  Administered Date(s) Administered   Fluad Quad(high Dose 65+) 09/04/2019   PFIZER(Purple Top)SARS-COV-2 Vaccination 10/23/2019, 11/11/2019    Health Maintenance  Topic Date Due   COVID-19 Vaccine (3 - Pfizer series) 06/10/2022 (Originally 01/06/2020)   Zoster Vaccines- Shingrix (1 of 2) 08/25/2022 (Originally 06/14/1993)   INFLUENZA VACCINE  01/10/2023 (Originally 05/12/2022)   Pneumonia Vaccine 76+ Years old (1 - PCV) 05/26/2023 (Originally 06/14/2008)   TETANUS/TDAP  05/26/2023 (Originally 06/14/1962)   Hepatitis C Screening  05/26/2023 (Originally 06/14/1961)   HPV VACCINES  Aged Out       Assessment  This is a routine wellness examination for [employer].  Health Maintenance: Due or Overdue There are no preventive care reminders to display for this patient.   Cody Stephens does not need a referral for Community Assistance: Care Management:   no  Social Work:    no Prescription Assistance:  no Nutrition/Diabetes Education:  no   Plan:  Personalized Goals  Goals Addressed                This Visit's Progress     Patient Stated (pt-stated)        Patient would like to continue to be stay healthy.       Personalized Health Maintenance & Screening Recommendations  Pneumococcal vaccine  Influenza vaccine Td vaccine Screening electrocardiogram Shingrix vaccine  Patient declined the vaccines at this time.   Lung Cancer Screening Recommended: no (Low Dose CT Chest recommended if Age 48-80 years, 30 pack-year currently smoking OR have quit w/in past 15 years) Hepatitis C Screening recommended: no HIV Screening recommended: no  Advanced Directives: Written information was not prepared per patient's request.  Referrals & Orders No orders of the defined types were placed in this encounter.   Follow-up Plan Follow-up with Cody Coombe, DO as planned Medicare wellness visit in one year. Patient will access AVS on my chart.   I have personally reviewed and noted the following in the patient's chart:   Medical and social history Use of alcohol, tobacco or illicit drugs  Current medications and supplements Functional ability and status Nutritional status Physical activity Advanced directives List of other physicians Hospitalizations, surgeries, and ER visits in previous 12 months Vitals Screenings to include cognitive, depression, and falls Referrals and appointments  In addition, I have reviewed and discussed with Cody Stephens certain preventive protocols, quality metrics, and best practice recommendations. A written personalized care plan for preventive services as well as general preventive health recommendations is available and can be mailed to the patient at his request.      Modesto Charon, RN BSN  05/25/2022

## 2022-06-01 DIAGNOSIS — H04123 Dry eye syndrome of bilateral lacrimal glands: Secondary | ICD-10-CM | POA: Diagnosis not present

## 2022-06-01 DIAGNOSIS — H18513 Endothelial corneal dystrophy, bilateral: Secondary | ICD-10-CM | POA: Diagnosis not present

## 2022-06-01 DIAGNOSIS — H26492 Other secondary cataract, left eye: Secondary | ICD-10-CM | POA: Diagnosis not present

## 2022-06-01 DIAGNOSIS — H401121 Primary open-angle glaucoma, left eye, mild stage: Secondary | ICD-10-CM | POA: Diagnosis not present

## 2022-06-01 DIAGNOSIS — Z961 Presence of intraocular lens: Secondary | ICD-10-CM | POA: Diagnosis not present

## 2022-06-01 DIAGNOSIS — H401112 Primary open-angle glaucoma, right eye, moderate stage: Secondary | ICD-10-CM | POA: Diagnosis not present

## 2022-07-14 DIAGNOSIS — H26492 Other secondary cataract, left eye: Secondary | ICD-10-CM | POA: Diagnosis not present

## 2022-07-21 ENCOUNTER — Ambulatory Visit: Payer: Medicare Other | Attending: Cardiology | Admitting: Cardiology

## 2022-07-21 ENCOUNTER — Encounter: Payer: Self-pay | Admitting: Cardiology

## 2022-07-21 VITALS — BP 160/74 | HR 57 | Ht 70.0 in | Wt 183.1 lb

## 2022-07-21 DIAGNOSIS — I10 Essential (primary) hypertension: Secondary | ICD-10-CM | POA: Diagnosis not present

## 2022-07-21 DIAGNOSIS — E782 Mixed hyperlipidemia: Secondary | ICD-10-CM | POA: Insufficient documentation

## 2022-07-21 NOTE — Patient Instructions (Signed)
Medication Instructions:  °Your physician recommends that you continue on your current medications as directed. Please refer to the Current Medication list given to you today.  °*If you need a refill on your cardiac medications before your next appointment, please call your pharmacy* ° ° °Lab Work: °None ordered °If you have labs (blood work) drawn today and your tests are completely normal, you will receive your results only by: °MyChart Message (if you have MyChart) OR °A paper copy in the mail °If you have any lab test that is abnormal or we need to change your treatment, we will call you to review the results. ° ° °Testing/Procedures: °None ordered ° ° °Follow-Up: °At [facility], you and your health needs are our priority.  As part of our continuing mission to provide you with exceptional heart care, we have created designated Provider Care Teams.  These Care Teams include your primary Cardiologist (physician) and Advanced Practice Providers (APPs -  Physician Assistants and Nurse Practitioners) who all work together to provide you with the care you need, when you need it. ° °We recommend signing up for the patient portal called "MyChart".  Sign up information is provided on this After Visit Summary.  MyChart is used to connect with patients for Virtual Visits (Telemedicine).  Patients are able to view lab/test results, encounter notes, upcoming appointments, etc.  Non-urgent messages can be sent to your provider as well.   °To learn more about what you can do with MyChart, go to [email or web address].   ° °Your next appointment:   °2 month(s) ° °The format for your next appointment:   °In Person ° °Provider:   °Rajan Revankar, MD ° ° °Other Instructions °NA  °

## 2022-07-21 NOTE — Progress Notes (Signed)
Cardiology Office Note:    Date:  07/21/2022   ID:  Cody Stephens, DOB 1940-12-26, MRN [id number]  PCP:  Cody Nutting, DO  Cardiologist:  Jenean Lindau, MD   Referring MD: Cody Nutting, DO    ASSESSMENT:    1. Essential hypertension   2. Mixed hyperlipidemia    PLAN:    In order of problems listed above:  Primary prevention stressed with the patient.  Importance of compliance with diet medication stressed any vocalized understanding. Palpitations: These have resolved and he is happy about it. Elevated blood pressure without a diagnosis of hypertension.  The patient is happy about the fact that his blood pressures are fine at home when he checks it. Calcium score of 0: He is happy about this.  I discussed this with him at length and questions were answered to satisfaction. He will be made a follow-up appointment on an annual basis.   Medication Adjustments/Labs and Tests Ordered: Current medicines are reviewed at length with the patient today.  Concerns regarding medicines are outlined above.  No orders of the defined types were placed in this encounter.  No orders of the defined types were placed in this encounter.    No chief complaint on file.    History of Present Illness:    Cody Stephens is a 81 y.o. male.  Patient has past medical history of essential hypertension, dyslipidemia and palpitations.  He mentions to me that he is doing the best he has done in several years.  He is very active and exercising.  No chest pain orthopnea or PND.  At the time of my evaluation, the patient is alert awake oriented and in no distress.  He has an element of whitecoat hypertension.  His blood pressures at home is fine and he mentioned to me the readings.  Past Medical History:  Diagnosis Date   Abnormal TSH 12/18/2020   Colon cancer screening declined 02/28/2018   Essential hypertension 07/03/2019   Glaucoma    Hyperlipidemia 12/18/2020   Hypertension    on meds    Hypertension goal BP (blood pressure) < 140/90 02/28/2018   Nasal congestion 11/24/2018   Palpitations 11/17/2018   Prepatellar bursitis, left knee 03/04/2022   Refused pneumococcal vaccination 02/28/2018   Wears hearing aid in both ears 02/28/2018    Past Surgical History:  Procedure Laterality Date   DENTAL SURGERY  2016   implant   EYE SURGERY  Feb 2021   Cataract surgery    Current Medications: Current Meds  Medication Sig   latanoprost (XALATAN) 0.005 % ophthalmic solution Place 1 drop into both eyes at bedtime.     Allergies:   Patient has no known allergies.   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  Like to square dance, photography and travels doing that when able.   Social Determinants of Health   Financial Resource Strain: Low Risk  (05/21/2022)   Overall Financial Resource Strain (CARDIA)    Difficulty of Paying Living Expenses: Not hard at all  Food Insecurity: No Food Insecurity (05/21/2022)   Hunger Vital Sign    Worried About Running Out of Food in the Last Year: Never true    Ran Out of Food in the Last Year: Never true  Transportation Needs: No Transportation Needs (05/21/2022)   PRAPARE - Administrator, Civil Service (Medical): No    Lack of Transportation (Non-Medical): No  Physical Activity: Sufficiently Active (05/21/2022)   Exercise Vital  Sign    Days of Exercise per Week: 6 days    Minutes of Exercise per Session: 40 min  Stress: No Stress Concern Present (05/21/2022)   [employer] of Occupational Health - Occupational Stress Questionnaire    Feeling of Stress : Not at all  Social Connections: Socially Integrated (05/25/2022)   Social Connection and Isolation Panel [NHANES]    Frequency of Communication with Friends and Family: Once a week    Frequency of Social Gatherings with Friends and Family: More than three times a week    Attends Religious Services: More than 4 times per year    Active Member of [employer] or Organizations: Yes    Attends Engineer, structural: More than 4 times per year    Marital Status: Married     Family History: The patient's family history includes Cancer in his father; Hearing loss in his father; Hypertension in his mother.  ROS:   Please see the history of present illness.    All other systems reviewed and are negative.  EKGs/Labs/Other Studies Reviewed:    The following studies were reviewed today: EKG reveals sinus rhythm and nonspecific ST-T changes   Recent Labs: No results found for requested labs within last 365 days.  Recent Lipid Panel    Component Value Date/Time   CHOL 208 (H) 12/19/2020 0000   CHOL 185 01/02/2020 0828   TRIG 94 12/19/2020 0000   HDL 52 12/19/2020 0000   HDL 51 01/02/2020 0828   CHOLHDL 4.0 12/19/2020 0000   LDLCALC 136 (H) 12/19/2020 0000    Physical Exam:    VS:  BP (!) 160/74   Pulse (!) 57   Ht 5\' 10"  (1.778 m)   Wt 183 lb 1.3 oz (83 kg)   SpO2 96%   BMI 26.27 kg/m     Wt Readings from Last 3 Encounters:  07/21/22 183 lb 1.3 oz (83 kg)  03/25/21 171 lb 1.9 oz (77.6 kg)  01/14/21 175 lb 1.3 oz (79.4 kg)     GEN: Patient is in no acute distress HEENT: Normal NECK: No JVD; No carotid bruits LYMPHATICS: No lymphadenopathy CARDIAC: Hear sounds regular, 2/6 systolic murmur at the apex. RESPIRATORY:  Clear to auscultation  without rales, wheezing or rhonchi  ABDOMEN: Soft, non-tender, non-distended MUSCULOSKELETAL:  No edema; No deformity  SKIN: Warm and dry NEUROLOGIC:  Alert and oriented x 3 PSYCHIATRIC:  Normal affect   Signed, 03/16/21, MD  07/21/2022 11:31 AM    [facility]

## 2022-09-29 DIAGNOSIS — H401121 Primary open-angle glaucoma, left eye, mild stage: Secondary | ICD-10-CM | POA: Diagnosis not present

## 2022-09-29 DIAGNOSIS — H401112 Primary open-angle glaucoma, right eye, moderate stage: Secondary | ICD-10-CM | POA: Diagnosis not present

## 2022-09-29 DIAGNOSIS — H04123 Dry eye syndrome of bilateral lacrimal glands: Secondary | ICD-10-CM | POA: Diagnosis not present

## 2022-09-29 DIAGNOSIS — H18513 Endothelial corneal dystrophy, bilateral: Secondary | ICD-10-CM | POA: Diagnosis not present

## 2022-10-09 ENCOUNTER — Encounter: Payer: Self-pay | Admitting: Family Medicine

## 2022-10-12 ENCOUNTER — Telehealth: Payer: Self-pay | Admitting: Family Medicine

## 2022-10-12 ENCOUNTER — Ambulatory Visit
Admission: EM | Admit: 2022-10-12 | Discharge: 2022-10-12 | Disposition: A | Discharge status: Home / Self Care | Payer: Medicare Other | Attending: Family Medicine | Admitting: Family Medicine

## 2022-10-12 DIAGNOSIS — I10 Essential (primary) hypertension: Secondary | ICD-10-CM | POA: Diagnosis not present

## 2022-10-12 MED ORDER — AMLODIPINE BESYLATE 5 MG PO TABS: 5.0000 mg | ORAL_TABLET | Freq: Every day | ORAL | 0 refills | Status: DC

## 2022-10-12 NOTE — Telephone Encounter (Signed)
Pharmacy change due to holiday

## 2022-10-12 NOTE — ED Triage Notes (Signed)
Pt presents with c/o htn. Pt endorses the he is out of BP medications and BP was 201/104. Pt has scheduled appt with Dr. Zigmund Daniel on Wednesday. Pt states he has HA yesterday, denies one today.

## 2022-10-12 NOTE — Discharge Instructions (Signed)
Take amlodipine 10 mg today. Continue to follow your blood pressure See Dr. Zigmund Daniel next week If you feel worse instead of better, headache blurred vision, COVID directly to an emergency room or call 911

## 2022-10-12 NOTE — Telephone Encounter (Signed)
Pharmacy change

## 2022-10-12 NOTE — ED Provider Notes (Signed)
[facility]    CSN: [id number] Arrival date & time: 10/12/22  0900      History   Chief Complaint Chief Complaint  Patient presents with   Hypertension    HPI EDDER Stephens is a 82 y.o. male.   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  He had hypertension previously and took amlodipine.  He states that he had amlodipine until last week, but then ran out.  He has not been taking amlodipine because his blood pressure had returned to normal, but would take 1 on days that his blood pressure warranted.  He checks his blood pressure daily takes his blood pressure medicine as needed.  He saw cardiology in 10 of 2023 for hypertension and was not advised to take regular medication.  His blood pressure has been creeping up.  He called Dr. Zigmund Daniel on 10/09/2022 for a refill of amlodipine.  He is due for a visit tensa was made an appointment for this week.  He is here today because his blood pressure was alarming at 201/104.  He does not have any headache, visual changes, dizzy spells, nausea.  He is here requesting a refill of his amlodipine  Past Medical History:  Diagnosis Date   Abnormal TSH 12/18/2020   Colon cancer screening declined 02/28/2018   Essential hypertension 07/03/2019   Glaucoma    Hyperlipidemia 12/18/2020   Hypertension    on meds   Hypertension goal BP (blood pressure) < 140/90 02/28/2018   Nasal congestion 11/24/2018   Palpitations 11/17/2018   Prepatellar bursitis, left knee 03/04/2022   Refused pneumococcal vaccination 02/28/2018   Wears hearing aid in both ears 02/28/2018    Patient Active Problem List   Diagnosis Date Noted   Prepatellar bursitis, left knee 03/04/2022   Hypertension    Abnormal TSH 12/18/2020   Hyperlipidemia 12/18/2020   Essential hypertension 07/03/2019   Nasal congestion 11/24/2018   Palpitations 11/17/2018   Hypertension goal BP (blood pressure) < 140/90 02/28/2018    Wears hearing aid in both ears 02/28/2018   Colon cancer screening declined 02/28/2018   Refused pneumococcal vaccination 02/28/2018   Glaucoma 02/28/2018    Past Surgical History:  Procedure Laterality Date   DENTAL SURGERY  2016   implant   EYE SURGERY  Feb 2021   Cataract surgery       Home Medications    Prior to Admission medications   Medication Sig Start Date End Date Taking? Authorizing Provider  amLODipine (NORVASC) 5 MG tablet Take 1 tablet (5 mg total) by mouth daily. 10/12/22  Yes Raylene Everts, MD  latanoprost (XALATAN) 0.005 % ophthalmic solution Place 1 drop into both eyes at bedtime.    [provider]    Family History Family History  Problem Relation Age of Onset   Hypertension Mother    Cancer Father    Hearing loss Father     Social History Social History   Tobacco Use   Smoking status: Never   Smokeless tobacco: Never  Vaping Use   Vaping Use: Never used  Substance Use Topics   Alcohol use: Yes    Alcohol/week: 1.0 standard drink of alcohol    Types: 1 Cans of beer per week    Comment: occasionally   Drug use: Never     Allergies   Patient has no known allergies.   Review of Systems Review of Systems See hypertension  Physical Exam Triage  Vital Signs ED Triage Vitals [10/12/22 0909]  Enc Vitals Group     BP (!) 241/126     Pulse Rate 76     Resp 14     Temp 98.6 F (37 C)     Temp src      SpO2 95 %     Weight      Height      Head Circumference      Peak Flow      Pain Score 0     Pain Loc      Pain Edu?      Excl. in GC?    No data found.  Updated Vital Signs BP (!) 241/126 (BP Location: Left Arm)   Pulse 76   Temp 98.6 F (37 C)   Resp 14   SpO2 95%       Physical Exam Constitutional:      General: He is not in acute distress.    Appearance: He is well-developed and normal weight.  HENT:     Head: Normocephalic and atraumatic.     Ears:     Comments: Hearing aids    Mouth/Throat:      Mouth: Mucous membranes are moist.  Eyes:     Conjunctiva/sclera: Conjunctivae normal.     Pupils: Pupils are equal, round, and reactive to light.     Comments: Fundi benign.  No hemorrhage  Neck:     Vascular: No carotid bruit.  Cardiovascular:     Rate and Rhythm: Normal rate and regular rhythm.     Heart sounds: Normal heart sounds.  Pulmonary:     Effort: Pulmonary effort is normal. No respiratory distress.     Breath sounds: Normal breath sounds.  Musculoskeletal:        General: Normal range of motion.     Cervical back: Normal range of motion.     Right lower leg: No edema.     Left lower leg: No edema.  Skin:    General: Skin is warm and dry.  Neurological:     General: No focal deficit present.     Mental Status: He is alert.     Gait: Gait normal.      UC Treatments / Results  Labs (all labs ordered are listed, but only abnormal results are displayed) Labs Reviewed - No data to display  EKG   Radiology No results found.  Procedures Procedures (including critical care time)  Medications Ordered in UC Medications - No data to display  Initial Impression / Assessment and Plan / UC Course  I have reviewed the triage vital signs and the nursing notes.  Pertinent labs & imaging results that were available during my care of the patient were reviewed by me and considered in my medical decision making (see chart for details).     Reviewed that the blood pressure is alarmingly high.  Discussed emergency room visit if he fails to improve or has worsening symptoms.  The emergency room has a many hour wait, and I think you will be better served by trying to treat this at home. Final Clinical Impressions(s) / UC Diagnoses   Final diagnoses:  Uncontrolled hypertension     Discharge Instructions      Take amlodipine 10 mg today. Continue to follow your blood pressure See Dr. Ashley Royalty next week If you feel worse instead of better, headache blurred vision,  COVID directly to an emergency room or call 911   ED Prescriptions  Medication Sig Dispense Auth. Provider   amLODipine (NORVASC) 5 MG tablet Take 1 tablet (5 mg total) by mouth daily. 30 tablet Raylene Everts, MD      PDMP not reviewed this encounter.   Raylene Everts, MD 10/12/22 [phone]

## 2022-10-14 ENCOUNTER — Encounter: Payer: Self-pay | Admitting: Family Medicine

## 2022-10-14 ENCOUNTER — Ambulatory Visit (INDEPENDENT_AMBULATORY_CARE_PROVIDER_SITE_OTHER): Payer: Medicare Other | Admitting: Family Medicine

## 2022-10-14 VITALS — BP 166/78 | HR 63 | Ht 70.0 in | Wt 184.0 lb

## 2022-10-14 DIAGNOSIS — R7989 Other specified abnormal findings of blood chemistry: Secondary | ICD-10-CM | POA: Diagnosis not present

## 2022-10-14 DIAGNOSIS — I10 Essential (primary) hypertension: Secondary | ICD-10-CM | POA: Diagnosis not present

## 2022-10-14 MED ORDER — AMLODIPINE BESYLATE 10 MG PO TABS: 10.0000 mg | ORAL_TABLET | Freq: Every day | ORAL | 1 refills | Status: DC

## 2022-10-14 NOTE — Progress Notes (Signed)
Cody Stephens - 82 y.o. male MRN [id number]  Date of birth: 04-03-1941  Subjective Chief Complaint  Patient presents with   Hypertension    HPI Cody Stephens is a 82 y.o. male here today for follow up of recent urgent care visit.  Noted to have elevated BP.  Previously on amlodipine but ran out. This was restarted at 5mg  daily at urgent care.  BP at home was 201/104.   He did not have any symptoms at that time including chest pain, shortness of breath, palpitations, headache or vision changes.  He is doing well with restarting this and BP is lower.  Reports that cafe at retirement home cooks with more salt compared to their previous residence.    ROS:  A comprehensive ROS was completed and negative except as noted per HPI  No Known Allergies  Past Medical History:  Diagnosis Date   Abnormal TSH 12/18/2020   Colon cancer screening declined 02/28/2018   Essential hypertension 07/03/2019   Glaucoma    Hyperlipidemia 12/18/2020   Hypertension    on meds   Hypertension goal BP (blood pressure) < 140/90 02/28/2018   Nasal congestion 11/24/2018   Palpitations 11/17/2018   Prepatellar bursitis, left knee 03/04/2022   Refused pneumococcal vaccination 02/28/2018   Wears hearing aid in both ears 02/28/2018    Past Surgical History:  Procedure Laterality Date   DENTAL SURGERY  2016   implant   EYE SURGERY  Feb 2021   Cataract surgery    Social History   Socioeconomic History   Marital status: Married    Spouse name: Charlett Nose   Number of children: 3   Years of education: 16   Highest education level: Occupational hygienist History   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 Like to square dance, photography and travels doing that when able.   Social Determinants of Health   Financial Resource Strain: Low Risk  (05/21/2022)   Overall Financial Resource Strain (CARDIA)    Difficulty of Paying Living Expenses: Not hard at all  Food Insecurity: No Food Insecurity (05/21/2022)   Hunger Vital Sign    Worried About Running Out of Food in the Last Year: Never true    Ran Out of Food in the Last Year: Never true  Transportation Needs: No Transportation Needs (05/21/2022)   PRAPARE - Hydrologist (Medical): No    Lack of Transportation (Non-Medical): No  Physical Activity: Sufficiently Active (05/21/2022)   Exercise Vital Sign    Days of Exercise per Week: 6 days    Minutes of Exercise per Session: 40 min  Stress: No Stress Concern Present (05/21/2022)   [facility]    Feeling of Stress : Not at all  Social Connections: [place] (05/25/2022)   Social Connection and Isolation Panel [NHANES]    Frequency of Communication with Friends and Family: Once a week    Frequency of Social Gatherings  with Friends and Family: More than three times a week    Attends Religious Services: More than 4 times per year    Active Member of Clubs or Organizations: Yes    Attends Music therapist: More than 4 times per year    Marital Status: Married    Family History  Problem Relation Age of Onset   Hypertension Mother    Cancer Father    Hearing loss Father     Health Maintenance  Topic Date Due   DTaP/Tdap/Td (1 - Tdap) Never done   INFLUENZA VACCINE  01/10/2023 (Originally 05/12/2022)   Pneumonia Vaccine 7+ Years old (1 - PCV) 05/26/2023 (Originally 06/14/2008)   Hepatitis C Screening  05/26/2023 (Originally 06/14/1961)   COVID-19 Vaccine (3 - 2023-24 season) 12/01/2023  (Originally 06/12/2022)   Zoster Vaccines- Shingrix (1 of 2) 01/13/2024 (Originally 06/14/1993)   Medicare Annual Wellness (AWV)  05/26/2023   HPV VACCINES  Aged Out     ----------------------------------------------------------------------------------------------------------------------------------------------------------------------------------------------------------------- Physical Exam BP (!) 166/78 (BP Location: Left Arm, Patient Position: Sitting, Cuff Size: Normal)   Pulse 63   Ht 5\' 10"  (1.778 m)   Wt 184 lb (83.5 kg)   SpO2 98%   BMI 26.40 kg/m   Physical Exam Constitutional:      Appearance: Normal appearance.  HENT:     Head: Normocephalic and atraumatic.  Eyes:     General: No scleral icterus. Cardiovascular:     Rate and Rhythm: Normal rate and regular rhythm.  Pulmonary:     Effort: Pulmonary effort is normal.     Breath sounds: Normal breath sounds.  Neurological:     Mental Status: He is alert.  Psychiatric:        Mood and Affect: Mood normal.        Behavior: Behavior normal.     ------------------------------------------------------------------------------------------------------------------------------------------------------------------------------------------------------------------- Assessment and Plan  Hypertension Recently restarted amlodipine.  BP improved from a few days ago but not well controlled.  Increasing amlodipine to 10mg .  Recheck at nurse visit in 2 weeks. Updated labs ordered today.    Meds ordered this encounter  Medications   amLODipine (NORVASC) 10 MG tablet    Sig: Take 1 tablet (10 mg total) by mouth daily.    Dispense:  90 tablet    Refill:  1    Return in about 6 months (around 04/14/2023) for HTN.    This visit occurred during the SARS-CoV-2 public health emergency.  Safety protocols were in place, including screening questions prior to the visit, additional usage of staff PPE, and extensive cleaning of exam room while  observing appropriate contact time as indicated for disinfecting solutions.

## 2022-10-14 NOTE — Patient Instructions (Signed)
Increase amlodipine to 10mg .  Follow up in 2-3 weeks for BP check.

## 2022-10-14 NOTE — Assessment & Plan Note (Signed)
Recently restarted amlodipine.  BP improved from a few days ago but not well controlled.  Increasing amlodipine to 10mg .  Recheck at nurse visit in 2 weeks. Updated labs ordered today.

## 2022-10-15 LAB — CBC WITH DIFFERENTIAL/PLATELET
Absolute Monocytes: 551 cells/uL (ref 200–950)
Basophils Absolute: 52 cells/uL (ref 0–200)
Basophils Relative: 0.9 %
Eosinophils Absolute: 162 cells/uL (ref 15–500)
Eosinophils Relative: 2.8 %
HCT: 50.6 % — ABNORMAL HIGH (ref 38.5–50.0)
Hemoglobin: 17.4 g/dL — ABNORMAL HIGH (ref 13.2–17.1)
Lymphs Abs: 1508 cells/uL (ref 850–3900)
MCH: 31.7 pg (ref 27.0–33.0)
MCHC: 34.4 g/dL (ref 32.0–36.0)
MCV: 92.2 fL (ref 80.0–100.0)
MPV: 11.1 fL (ref 7.5–12.5)
Monocytes Relative: 9.5 %
Neutro Abs: 3526 cells/uL (ref 1500–7800)
Neutrophils Relative %: 60.8 %
Platelets: 215 10*3/uL (ref 140–400)
RBC: 5.49 10*6/uL (ref 4.20–5.80)
RDW: 12.6 % (ref 11.0–15.0)
Total Lymphocyte: 26 %
WBC: 5.8 10*3/uL (ref 3.8–10.8)

## 2022-10-15 LAB — COMPLETE METABOLIC PANEL WITH GFR
AG Ratio: 1.6 (calc) (ref 1.0–2.5)
ALT: 9 U/L (ref 9–46)
AST: 19 U/L (ref 10–35)
Albumin: 4.4 g/dL (ref 3.6–5.1)
Alkaline phosphatase (APISO): 74 U/L (ref 35–144)
BUN: 15 mg/dL (ref 7–25)
CO2: 27 mmol/L (ref 20–32)
Calcium: 9.6 mg/dL (ref 8.6–10.3)
Chloride: 105 mmol/L (ref 98–110)
Creat: 1.24 mg/dL (ref 0.70–1.28)
Globulin: 2.8 g/dL (calc) (ref 1.9–3.7)
Glucose, Bld: 86 mg/dL (ref 65–99)
Potassium: 4.5 mmol/L (ref 3.5–5.3)
Sodium: 142 mmol/L (ref 135–146)
Total Bilirubin: 0.6 mg/dL (ref 0.2–1.2)
Total Protein: 7.2 g/dL (ref 6.1–8.1)
eGFR: 59 mL/min/{1.73_m2} — ABNORMAL LOW (ref >=60)

## 2022-10-15 LAB — TSH: TSH: 4.33 mIU/L (ref 0.40–4.50)

## 2022-10-28 ENCOUNTER — Ambulatory Visit (INDEPENDENT_AMBULATORY_CARE_PROVIDER_SITE_OTHER): Payer: Medicare Other | Admitting: Family Medicine

## 2022-10-28 VITALS — BP 164/86 | HR 80 | Ht 70.0 in | Wt 188.0 lb

## 2022-10-28 DIAGNOSIS — I10 Essential (primary) hypertension: Secondary | ICD-10-CM

## 2022-10-28 NOTE — Progress Notes (Signed)
Medical screening examination/treatment was performed by qualified clinical staff member and as supervising physician I was immediately available for consultation/collaboration. I have reviewed documentation and agree with assessment and plan.  Codee Tutson, DO  

## 2022-10-28 NOTE — Progress Notes (Signed)
   Subjective:    Patient ID: Cody Stephens, male    DOB: 1941-03-20, 82 y.o.   MRN: [id number]  HPI Pt here for bp check, pt denies any missed doses of medication.    Review of Systems     Objective:   Physical Exam        Assessment & Plan:  Pts b/p today was 164/86, pt advised follow up with PCP in 6 months.

## 2023-02-16 DIAGNOSIS — H401121 Primary open-angle glaucoma, left eye, mild stage: Secondary | ICD-10-CM | POA: Diagnosis not present

## 2023-02-16 DIAGNOSIS — H401112 Primary open-angle glaucoma, right eye, moderate stage: Secondary | ICD-10-CM | POA: Diagnosis not present

## 2023-05-06 ENCOUNTER — Encounter: Payer: Self-pay | Admitting: Family Medicine

## 2023-05-06 ENCOUNTER — Ambulatory Visit (INDEPENDENT_AMBULATORY_CARE_PROVIDER_SITE_OTHER): Payer: Medicare Other | Admitting: Family Medicine

## 2023-05-06 VITALS — BP 126/71 | HR 73 | Ht 70.0 in | Wt 188.0 lb

## 2023-05-06 DIAGNOSIS — D582 Other hemoglobinopathies: Secondary | ICD-10-CM

## 2023-05-06 DIAGNOSIS — I10 Essential (primary) hypertension: Secondary | ICD-10-CM | POA: Diagnosis not present

## 2023-05-06 HISTORY — DX: Other hemoglobinopathies: D58.2

## 2023-05-06 MED ORDER — AMLODIPINE BESYLATE 10 MG PO TABS: 10.0000 mg | ORAL_TABLET | Freq: Every day | ORAL | 3 refills | Status: DC

## 2023-05-06 NOTE — Progress Notes (Signed)
Cody Stephens - 82 y.o. male MRN [id number]  Date of birth: Oct 06, 1941  Subjective Chief Complaint  Patient presents with   Hypertension    HPI Cody Stephens is a 82 y.o. male here today for follow up.   He reports that he is feeling well.  Continues on amlodipine for management of HTN.  He brings his home cuff for verification as well.  Readings on home cuff are similar to reading in clinic today.  His list of home readings indicate that his BP is well controlled at home averaging around 135/70.  He denies symptoms related to HTN including headache, chest pain, dyspnea, or vision changes.   ROS:  A comprehensive ROS was completed and negative except as noted per HPI  Allergies  Allergen Reactions   Tetanus-Diphth-Acell Pertussis Other (See Comments)    Childhood reaction    Past Medical History:  Diagnosis Date   Abnormal TSH 12/18/2020   Colon cancer screening declined 02/28/2018   Essential hypertension 07/03/2019   Glaucoma    Hyperlipidemia 12/18/2020   Hypertension    on meds   Hypertension goal BP (blood pressure) < 140/90 02/28/2018   Nasal congestion 11/24/2018   Palpitations 11/17/2018   Prepatellar bursitis, left knee 03/04/2022   Refused pneumococcal vaccination 02/28/2018   Wears hearing aid in both ears 02/28/2018    Past Surgical History:  Procedure Laterality Date   DENTAL SURGERY  2016   implant   EYE SURGERY  Feb 2021   Cataract surgery    Social History   Socioeconomic History   Marital status: Married    Spouse name: Bosie Clos   Number of children: 3   Years of education: 16   Highest education level: Patent examiner History   Occupation: Retired Charity fundraiser    Comment: retired  Tobacco Use   Smoking status: Never   Smokeless tobacco: Never  Vaping Use   Vaping status: Never Used  Substance and Sexual Activity   Alcohol use: Yes    Alcohol/week: 1.0 standard drink of alcohol    Types: 1 Cans of beer per week    Comment: occasionally    Drug use: Never   Sexual activity: Not Currently  Other Topics Concern   Not on file  Social History Narrative   Lives with wife. Like to square dance, photography and travels doing that when able.   Social Determinants of Health   Financial Resource Strain: Low Risk  (05/02/2023)   Overall Financial Resource Strain (CARDIA)    Difficulty of Paying Living Expenses: Not hard at all  Food Insecurity: No Food Insecurity (05/02/2023)   Hunger Vital Sign    Worried About Running Out of Food in the Last Year: Never true    Ran Out of Food in the Last Year: Never true  Transportation Needs: No Transportation Needs (05/02/2023)   PRAPARE - Administrator, Civil Service (Medical): No    Lack of Transportation (Non-Medical): No  Physical Activity: Sufficiently Active (05/02/2023)   Exercise Vital Sign    Days of Exercise per Week: 6 days    Minutes of Exercise per Session: 40 min  Stress: No Stress Concern Present (05/02/2023)   [employer] of Occupational Health - Occupational Stress Questionnaire    Feeling of Stress : Not at all  Social Connections: Socially Integrated (05/02/2023)   Social Connection and Isolation Panel [NHANES]    Frequency of Communication with Friends and Family: More than three times a week  Frequency of Social Gatherings with Friends and Family: More than three times a week    Attends Religious Services: More than 4 times per year    Active Member of Clubs or Organizations: Yes    Attends Engineer, structural: More than 4 times per year    Marital Status: Married    Family History  Problem Relation Age of Onset   Hypertension Mother    Cancer Father    Hearing loss Father     Health Maintenance  Topic Date Due   Medicare Annual Wellness (AWV)  05/26/2023   Pneumonia Vaccine 18+ Years old (1 of 1 - PCV) 05/26/2023 (Originally 06/14/2008)   Hepatitis C Screening  05/26/2023 (Originally 06[birth year]14/1961)   COVID-19 Vaccine (3 - 2023-24  season) 12/01/2023 (Originally 06/12/2022)   Zoster Vaccines- Shingrix (1 of 2) 01/13/2024 (Originally 06/14/1993)   INFLUENZA VACCINE  05/13/2023   HPV VACCINES  Aged Out   DTaP/Tdap/Td  Discontinued     ----------------------------------------------------------------------------------------------------------------------------------------------------------------------------------------------------------------- Physical Exam BP 126/71   Pulse 73   Ht 5\' 10"  (1.778 m)   Wt 188 lb (85.3 kg)   SpO2 96%   BMI 26.98 kg/m   Physical Exam Constitutional:      Appearance: Normal appearance.  Cardiovascular:     Rate and Rhythm: Normal rate and regular rhythm.  Pulmonary:     Effort: Pulmonary effort is normal.     Breath sounds: Normal breath sounds.  Neurological:     Mental Status: He is alert.  Psychiatric:        Mood and Affect: Mood normal.        Behavior: Behavior normal.     ------------------------------------------------------------------------------------------------------------------------------------------------------------------------------------------------------------------- Assessment and Plan  Hypertension BP is well controlled at this time.  He will continue amlodipine at current strength. F/u in 6 months.   Elevated hemoglobin (HCC) Repeat CBC today.    Meds ordered this encounter  Medications   amLODipine (NORVASC) 10 MG tablet    Sig: Take 1 tablet (10 mg total) by mouth daily.    Dispense:  90 tablet    Refill:  3    Return in about 6 months (around 11/06/2023) for HTN.    This visit occurred during the SARS-CoV-2 public health emergency.  Safety protocols were in place, including screening questions prior to the visit, additional usage of staff PPE, and extensive cleaning of exam room while observing appropriate contact time as indicated for disinfecting solutions.

## 2023-05-06 NOTE — Assessment & Plan Note (Signed)
Repeat CBC today 

## 2023-05-06 NOTE — Assessment & Plan Note (Addendum)
BP is well controlled at this time.  He will continue amlodipine at current strength. F/u in 6 months.

## 2023-06-14 IMAGING — DX DG KNEE 1-2V*R*
2 series · 2 of 2 positions shown · non-contrast
Comparison: LEFT knee radiographs 03/04/2022

CLINICAL DATA: LEFT knee prepatellar bursitis, RIGHT knee for
comparison

EXAM:
RIGHT KNEE - 1-2 VIEW

[knee ap bilat standing]
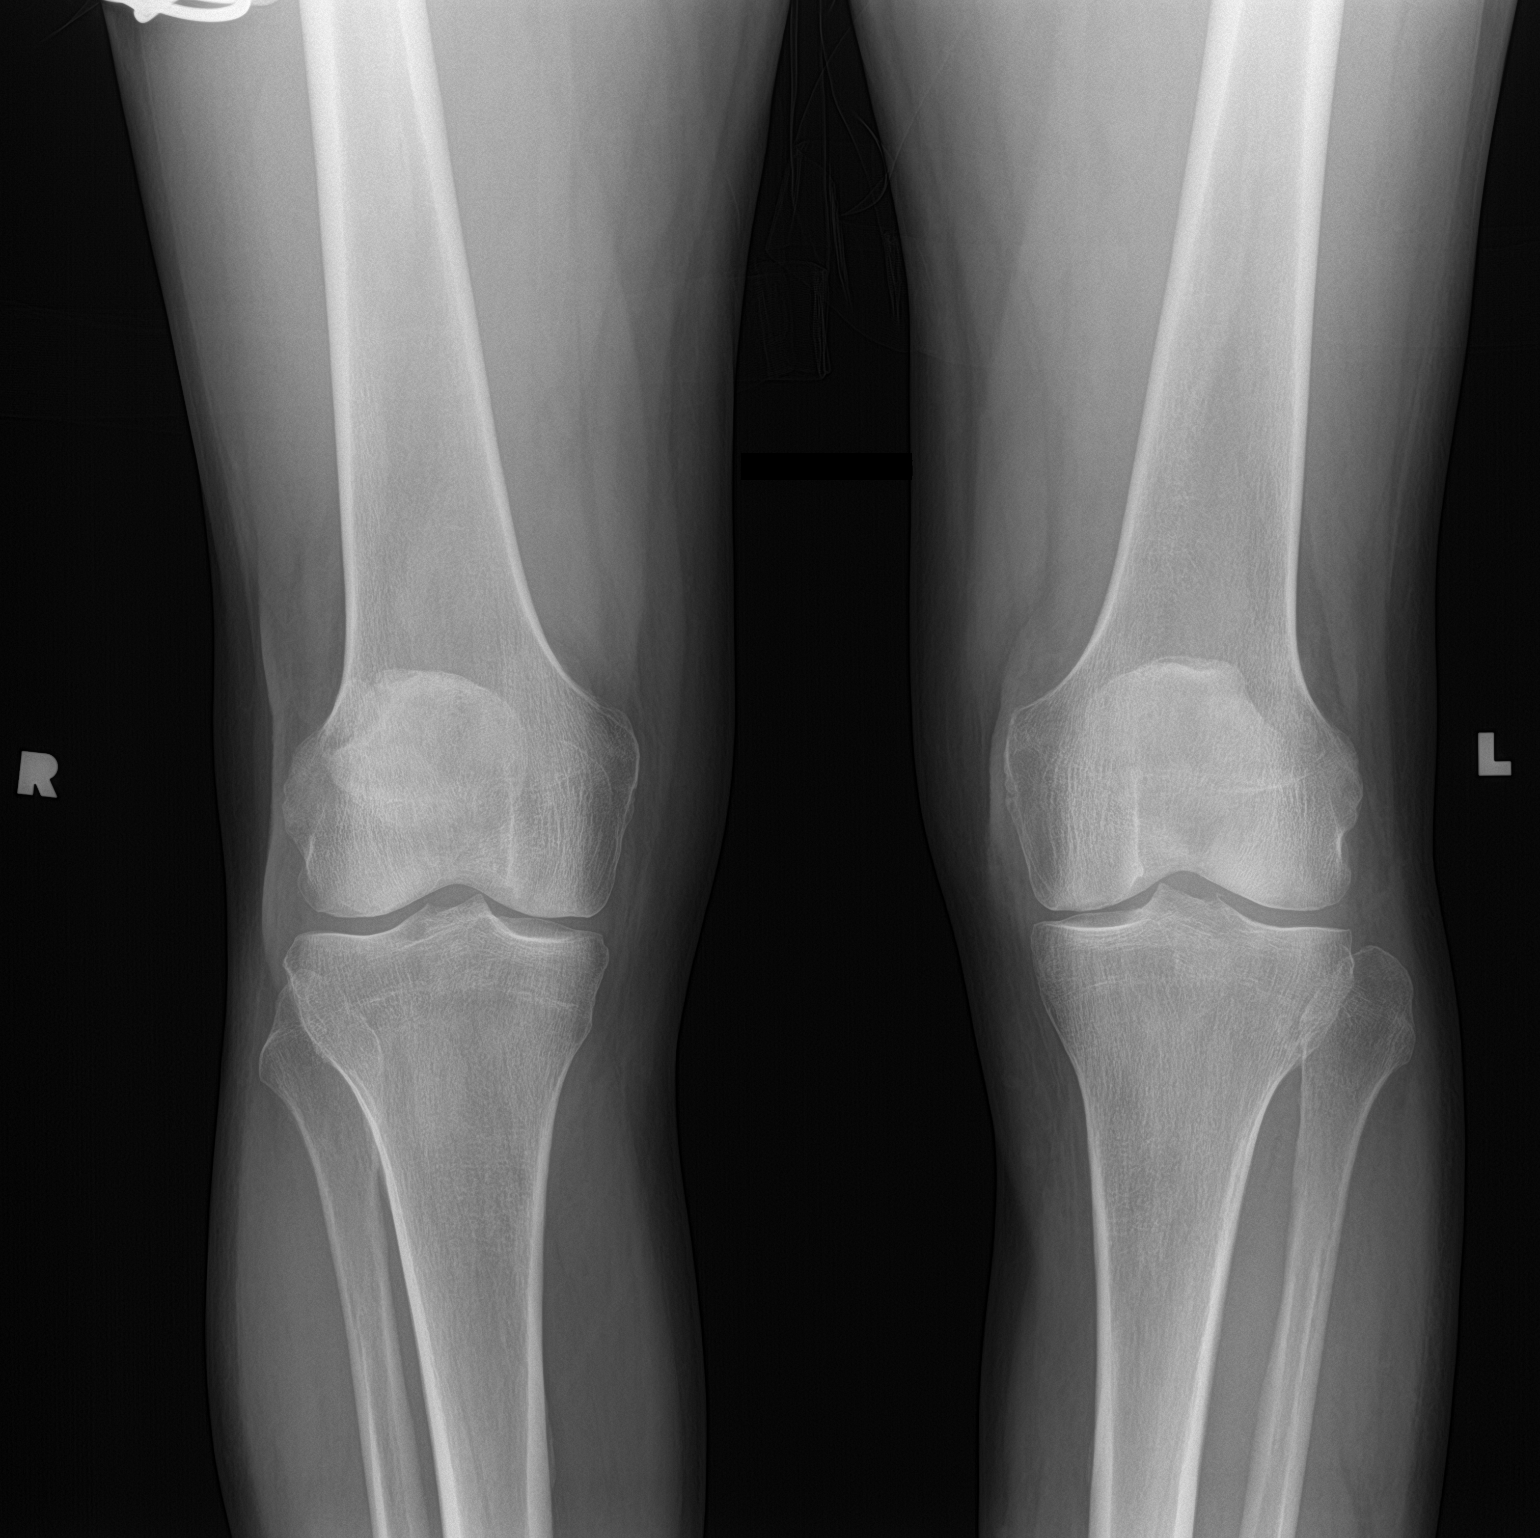

[knee tunnel]
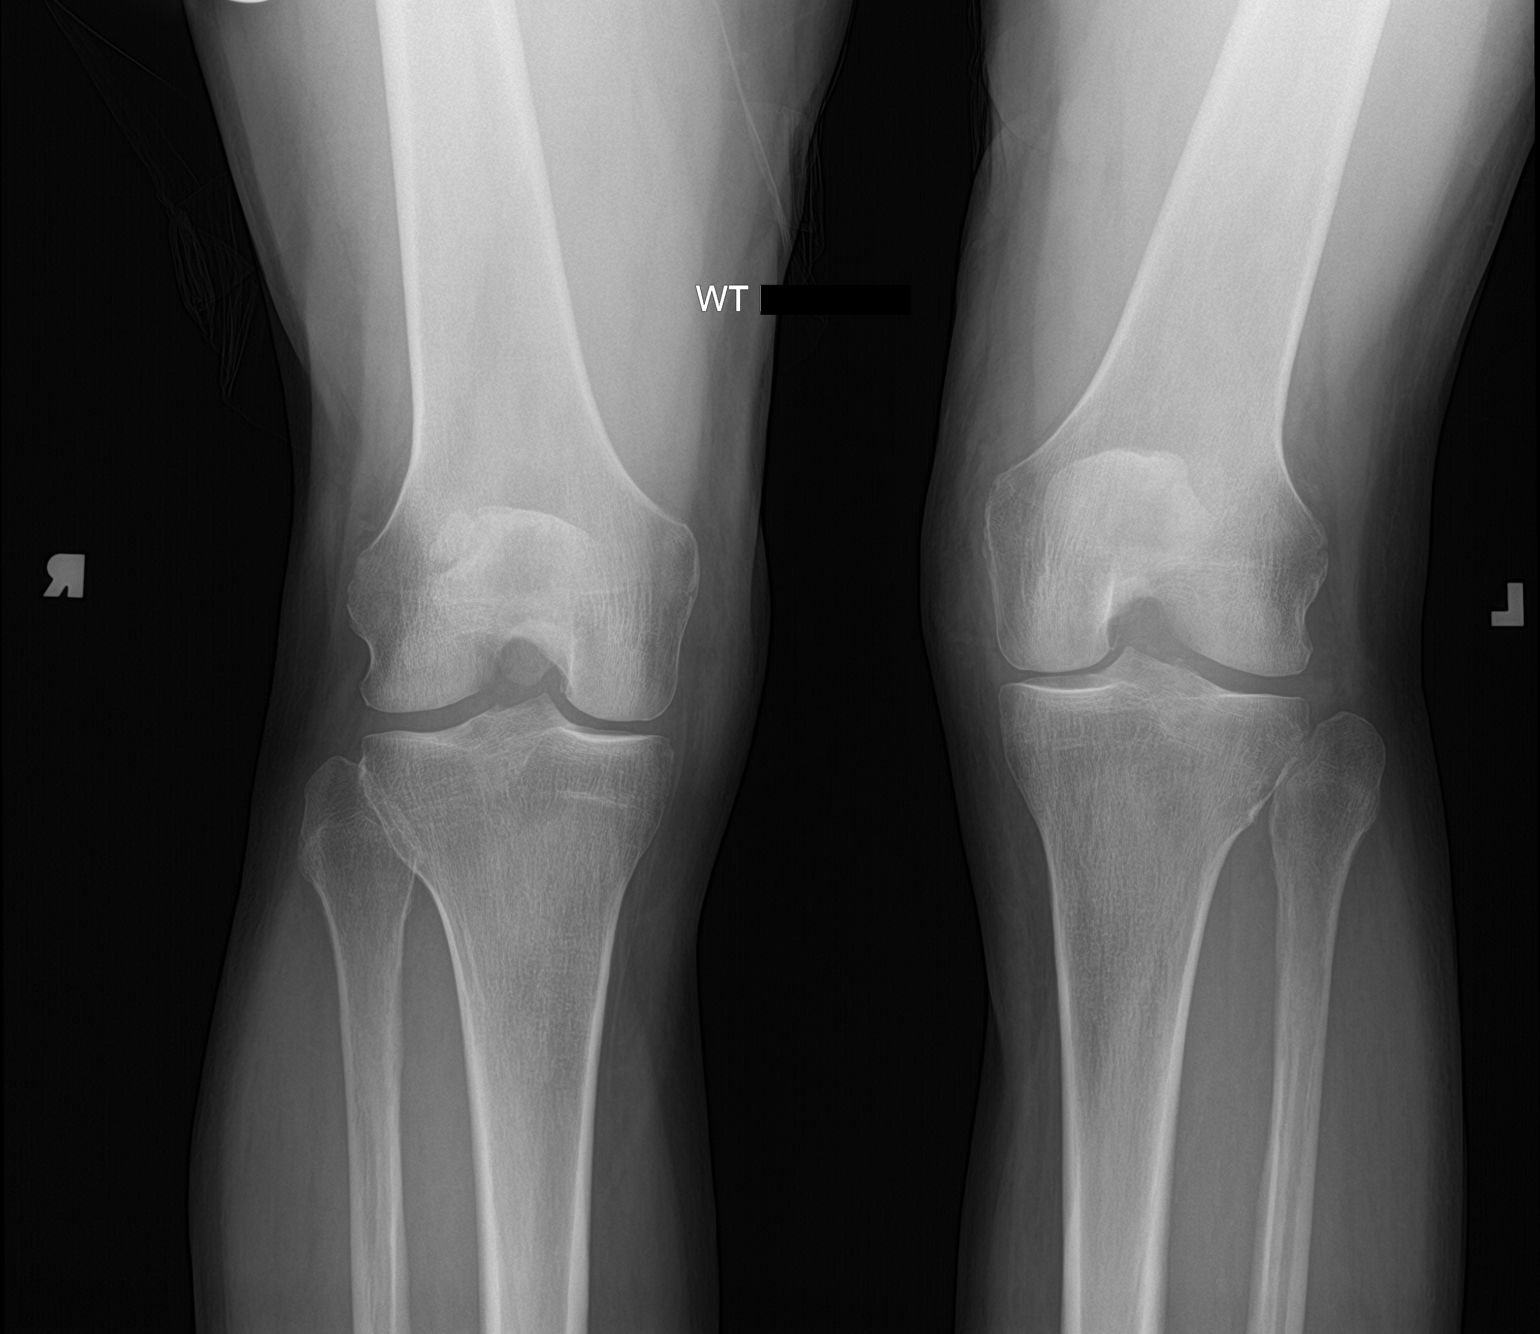

[2 of 2 positions shown; findings below may reference images not displayed]

FINDINGS: Osseous mineralization low normal.

Joint spaces preserved.

Bipartite patella.

No gross fracture, dislocation or bone destruction on single AP
view.
IMPRESSION: Bipartite patella, development variant.

No acute abnormalities.

## 2023-06-14 IMAGING — DX DG KNEE COMPLETE 4+V*L*
4 series · 4 of 4 positions shown · non-contrast
Comparison: None Available.

CLINICAL DATA: Prepatellar bursitis.  Pain.

EXAM:
LEFT KNEE - COMPLETE 4+ VIEW

[knee lat]
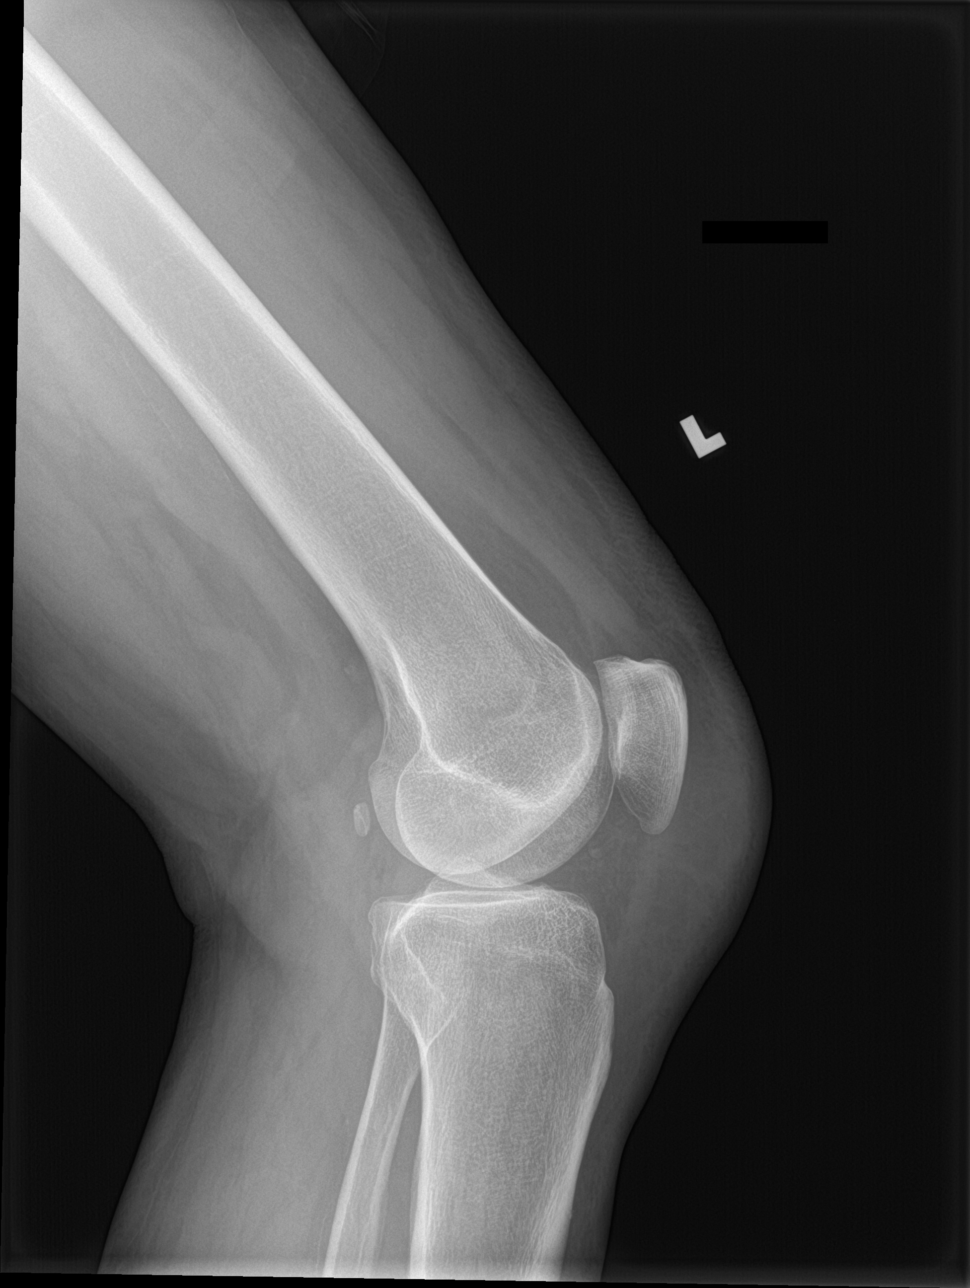

[knee ap bilat standing]
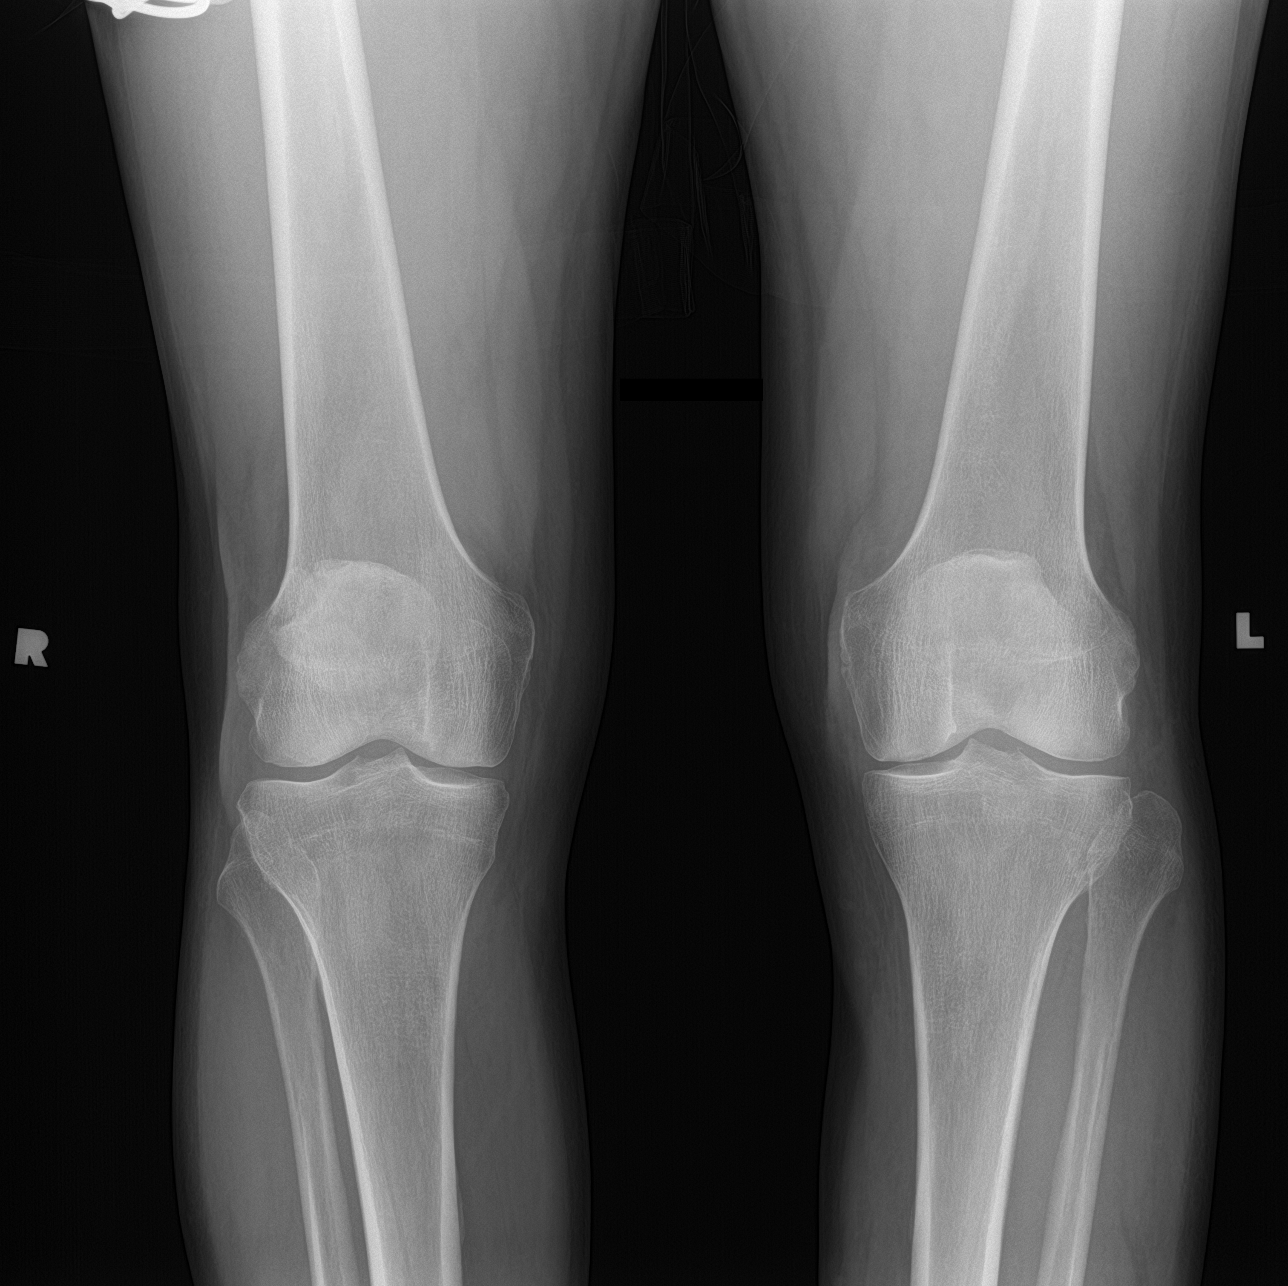

[knee sunrise standing]
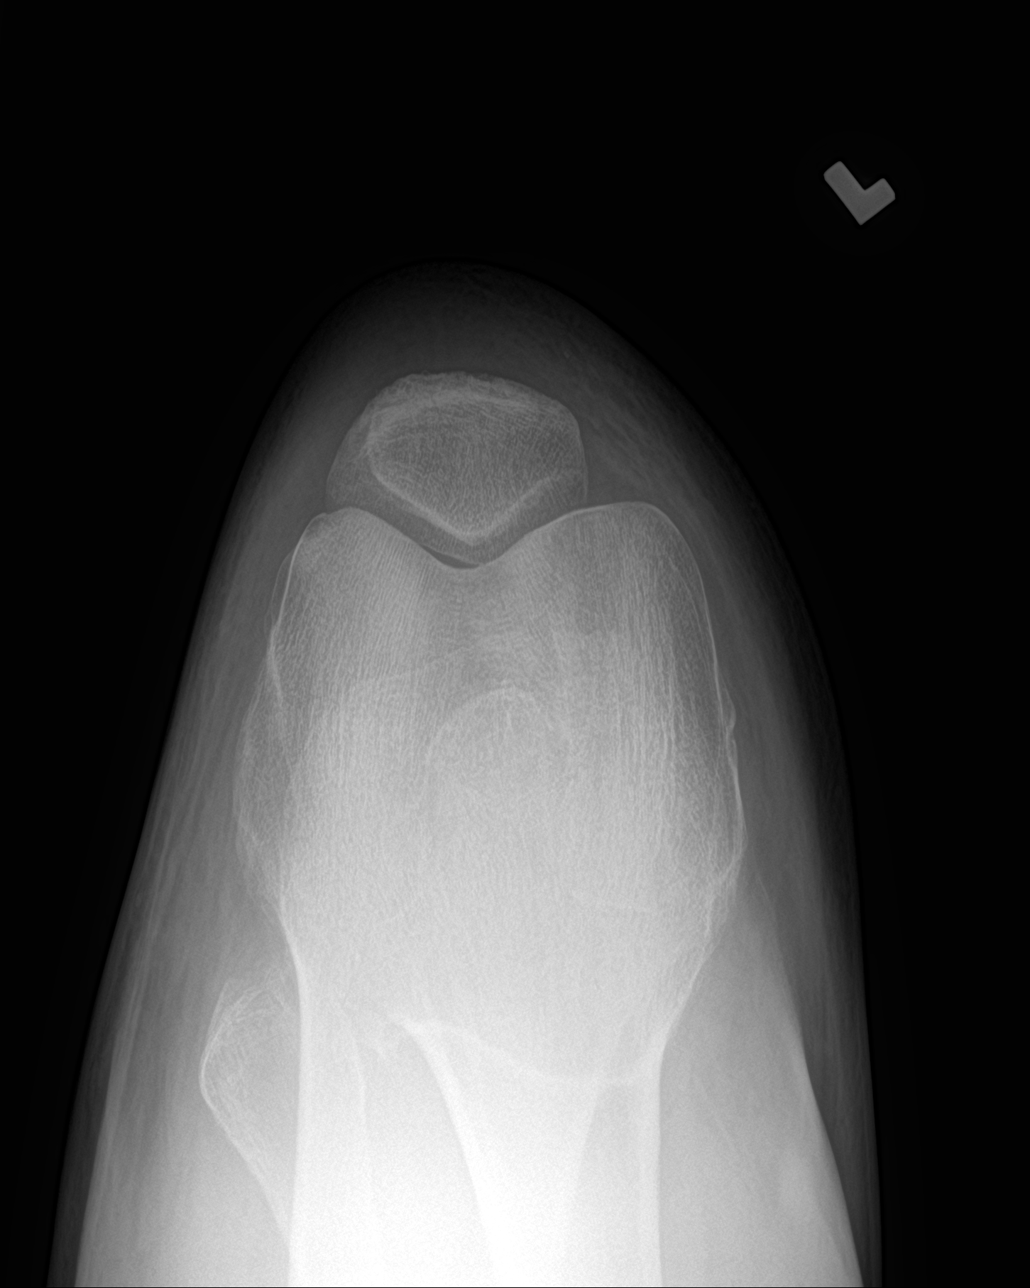

[knee tunnel]
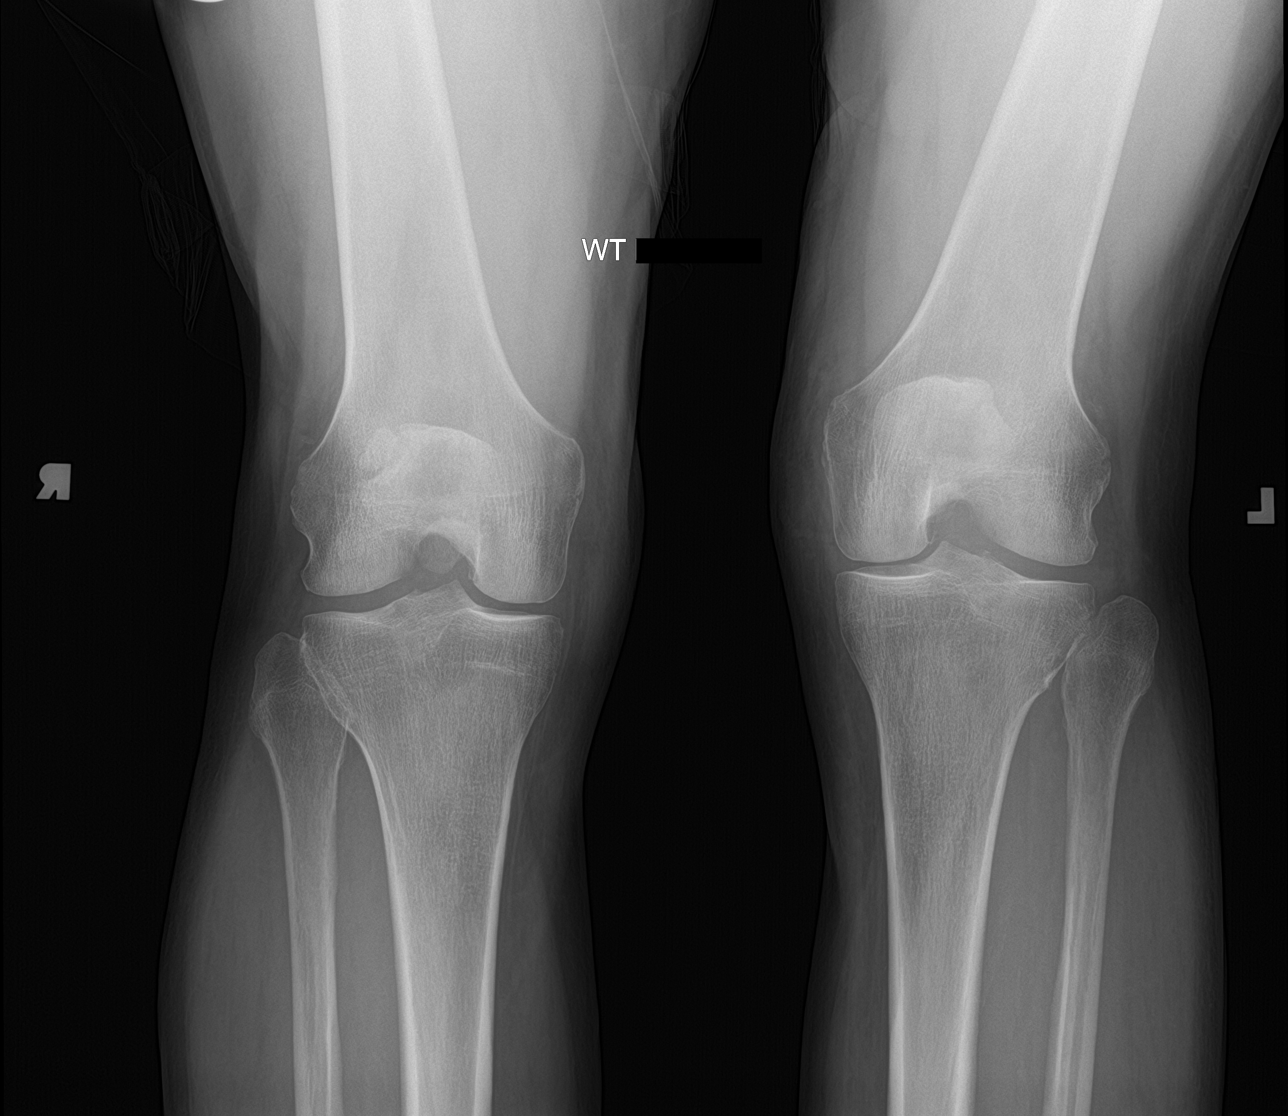

[4 of 4 positions shown; findings below may reference images not displayed]

FINDINGS: There is prepatellar soft tissue swelling. There is no joint
effusion. There is no acute fracture or dislocation. Joint spaces
are well maintained. Alignment is anatomic.
IMPRESSION: 1. Prepatellar soft tissue swelling.
2. No acute bony abnormality.

## 2023-06-28 DIAGNOSIS — H401121 Primary open-angle glaucoma, left eye, mild stage: Secondary | ICD-10-CM | POA: Diagnosis not present

## 2023-06-28 DIAGNOSIS — H401112 Primary open-angle glaucoma, right eye, moderate stage: Secondary | ICD-10-CM | POA: Diagnosis not present

## 2023-08-02 ENCOUNTER — Ambulatory Visit: Payer: Medicare Other | Attending: Cardiology | Admitting: Cardiology

## 2023-08-02 ENCOUNTER — Encounter: Payer: Self-pay | Admitting: Cardiology

## 2023-08-02 VITALS — BP 126/66 | HR 65 | Ht 70.0 in | Wt 186.0 lb

## 2023-08-02 DIAGNOSIS — I10 Essential (primary) hypertension: Secondary | ICD-10-CM | POA: Diagnosis not present

## 2023-08-02 DIAGNOSIS — E782 Mixed hyperlipidemia: Secondary | ICD-10-CM | POA: Insufficient documentation

## 2023-08-02 NOTE — Progress Notes (Signed)
Cardiology Office Note:    Date:  08/02/2023   ID:  Cody Stephens, DOB Nov 25, 1940, MRN [id number]  PCP:  Everrett Coombe, DO  Cardiologist:  Garwin Brothers, MD   Referring MD: Everrett Coombe, DO    ASSESSMENT:    1. Mixed hyperlipidemia   2. Essential hypertension    PLAN:    In order of problems listed above:  Primary prevention stressed with the patient.  Importance of compliance with diet medication stressed and patient verbalized standing. Essential hypertension: Blood pressure is stable and diet was emphasized.  Lifestyle modification urged.  Salt intake issues were discussed. Mixed dyslipidemia: Diet was emphasized.  Lipids were reviewed and questions were answered to satisfaction. He was advised to walk at least half an hour a day on a daily basis and he promises to do so. Patient will be seen in follow-up appointment in 12 months or earlier if the patient has any concerns.    Medication Adjustments/Labs and Tests Ordered: Current medicines are reviewed at length with the patient today.  Concerns regarding medicines are outlined above.  Orders Placed This Encounter  Procedures   EKG 12-Lead   No orders of the defined types were placed in this encounter.    No chief complaint on file.    History of Present Illness:    Cody Stephens is a 82 y.o. male.  Patient has past medical history of essential hypertension and dyslipidemia.  He is an active gentleman.  He denies any chest pain orthopnea or PND.  His calcium score done in the past was 0.  At the time of my evaluation, the patient is alert awake oriented and in no distress.  He exercises on a regular basis and does line dancing.  Past Medical History:  Diagnosis Date   Abnormal TSH 12/18/2020   Colon cancer screening declined 02/28/2018   Elevated hemoglobin (HCC) 05/06/2023   Glaucoma    Hyperlipidemia 12/18/2020   Hypertension    on meds   Hypertension goal BP (blood pressure) < 140/90 02/28/2018    Nasal congestion 11/24/2018   Palpitations 11/17/2018   Prepatellar bursitis, left knee 03/04/2022   Refused pneumococcal vaccination 02/28/2018   Wears hearing aid in both ears 02/28/2018    Past Surgical History:  Procedure Laterality Date   DENTAL SURGERY  2016   implant   EYE SURGERY  Feb 2021   Cataract surgery    Current Medications: Current Meds  Medication Sig   amLODipine (NORVASC) 10 MG tablet Take 1 tablet (10 mg total) by mouth daily.   latanoprost (XALATAN) 0.005 % ophthalmic solution Place 1 drop into both eyes at bedtime.     Allergies:   Tetanus-diphth-acell pertussis   Social History   Socioeconomic History   Marital status: Married    Spouse name: Bosie Clos   Number of children: 3   Years of education: 16   Highest education level: Patent examiner History   Occupation: Retired Charity fundraiser    Comment: retired  Tobacco Use   Smoking status: Never   Smokeless tobacco: Never  Vaping Use   Vaping status: Never Used  Substance and Sexual Activity   Alcohol use: Yes    Alcohol/week: 1.0 standard drink of alcohol    Types: 1 Cans of beer per week    Comment: occasionally   Drug use: Never   Sexual activity: Not Currently  Other Topics Concern   Not on file  Social History Narrative   Lives with wife.  Like to square dance, photography and travels doing that when able.   Social Determinants of Health   Financial Resource Strain: Low Risk  (05/02/2023)   Overall Financial Resource Strain (CARDIA)    Difficulty of Paying Living Expenses: Not hard at all  Food Insecurity: No Food Insecurity (05/02/2023)   Hunger Vital Sign    Worried About Running Out of Food in the Last Year: Never true    Ran Out of Food in the Last Year: Never true  Transportation Needs: No Transportation Needs (05/02/2023)   PRAPARE - Administrator, Civil Service (Medical): No    Lack of Transportation (Non-Medical): No  Physical Activity: Sufficiently Active  (05/02/2023)   Exercise Vital Sign    Days of Exercise per Week: 6 days    Minutes of Exercise per Session: 40 min  Stress: No Stress Concern Present (05/02/2023)   [employer] of Occupational Health - Occupational Stress Questionnaire    Feeling of Stress : Not at all  Social Connections: Socially Integrated (05/02/2023)   Social Connection and Isolation Panel [NHANES]    Frequency of Communication with Friends and Family: More than three times a week    Frequency of Social Gatherings with Friends and Family: More than three times a week    Attends Religious Services: More than 4 times per year    Active Member of [employer] or Organizations: Yes    Attends Engineer, structural: More than 4 times per year    Marital Status: Married     Family History: The patient's family history includes Cancer in his father; Hearing loss in his father; Hypertension in his mother.  ROS:   Please see the history of present illness.    All other systems reviewed and are negative.  EKGs/Labs/Other Studies Reviewed:    The following studies were reviewed today: .Marland KitchenEKG Interpretation Date/Time:  Monday August 02 2023 12:56:01 EDT Ventricular Rate:  65 PR Interval:  116 QRS Duration:  84 QT Interval:  370 QTC Calculation: 384 R Axis:   78  Text Interpretation: Normal sinus rhythm Cannot rule out Anterior infarct , age undetermined ST & T wave abnormality, consider inferior ischemia When compared with ECG of 02-Nov-2018 20:26, Premature ventricular complexes are no longer Present ST elevation now present in Inferior leads T wave inversion now evident in Inferior leads Confirmed by Belva Crome [phone]) on 08/02/2023 1:08:15 PM     Recent Labs: 10/14/2022: ALT 9; BUN 15; Creat 1.24; Potassium 4.5; Sodium 142; TSH 4.33 05/06/2023: Hemoglobin 16.3; Platelets 221  Recent Lipid Panel    Component Value Date/Time   CHOL 208 (H) 12/19/2020 0000   CHOL 185 01/02/2020 0828   TRIG 94 12/19/2020  0000   HDL 52 12/19/2020 0000   HDL 51 01/02/2020 0828   CHOLHDL 4.0 12/19/2020 0000   LDLCALC 136 (H) 12/19/2020 0000    Physical Exam:    VS:  BP 126/66   Pulse 65   Ht 5\' 10"  (1.778 m)   Wt 186 lb (84.4 kg)   SpO2 97%   BMI 26.69 kg/m     Wt Readings from Last 3 Encounters:  08/02/23 186 lb (84.4 kg)  05/06/23 188 lb (85.3 kg)  10/28/22 188 lb (85.3 kg)     GEN: Patient is in no acute distress HEENT: Normal NECK: No JVD; No carotid bruits LYMPHATICS: No lymphadenopathy CARDIAC: Hear sounds regular, 2/6 systolic murmur at the apex. RESPIRATORY:  Clear to auscultation without rales, wheezing  or rhonchi  ABDOMEN: Soft, non-tender, non-distended MUSCULOSKELETAL:  No edema; No deformity  SKIN: Warm and dry NEUROLOGIC:  Alert and oriented x 3 PSYCHIATRIC:  Normal affect   Signed, Garwin Brothers, MD  08/02/2023 1:09 PM    [facility]

## 2023-08-02 NOTE — Patient Instructions (Signed)

## 2023-11-08 ENCOUNTER — Encounter: Payer: Self-pay | Admitting: Family Medicine

## 2023-11-08 ENCOUNTER — Ambulatory Visit (INDEPENDENT_AMBULATORY_CARE_PROVIDER_SITE_OTHER): Payer: Medicare Other | Admitting: Family Medicine

## 2023-11-08 VITALS — BP 130/71 | HR 64 | Ht 70.0 in | Wt 185.0 lb

## 2023-11-08 DIAGNOSIS — I10 Essential (primary) hypertension: Secondary | ICD-10-CM | POA: Diagnosis not present

## 2023-11-08 DIAGNOSIS — R7989 Other specified abnormal findings of blood chemistry: Secondary | ICD-10-CM | POA: Diagnosis not present

## 2023-11-08 DIAGNOSIS — E782 Mixed hyperlipidemia: Secondary | ICD-10-CM | POA: Diagnosis not present

## 2023-11-08 NOTE — Progress Notes (Signed)
Cody Stephens - 83 y.o. male MRN [id number]  Date of birth: 30-Jun-1941  Subjective Chief Complaint  Patient presents with   Hypertension    HPI Cody Stephens is a 83 y.o. male here today for follow up.     He reports that he is doing well.  He continues on amlodipine for management of  HTN.  BP is well controlled at current strength.  He denies side effects at this time.  He has not had chest pain, shortness of breath, palpitations, headache or vision changes.    ROS:  A comprehensive ROS was completed and negative except as noted per HPI  Allergies  Allergen Reactions   Tetanus-Diphth-Acell Pertussis Other (See Comments)    Childhood reaction    Past Medical History:  Diagnosis Date   Abnormal TSH 12/18/2020   Colon cancer screening declined 02/28/2018   Elevated hemoglobin (HCC) 05/06/2023   Glaucoma    Hyperlipidemia 12/18/2020   Hypertension    on meds   Hypertension goal BP (blood pressure) < 140/90 02/28/2018   Nasal congestion 11/24/2018   Palpitations 11/17/2018   Prepatellar bursitis, left knee 03/04/2022   Refused pneumococcal vaccination 02/28/2018   Wears hearing aid in both ears 02/28/2018    Past Surgical History:  Procedure Laterality Date   DENTAL SURGERY  2016   implant   EYE SURGERY  Feb 2021   Cataract surgery    Social History   Socioeconomic History   Marital status: Married    Spouse name: Bosie Clos   Number of children: 3   Years of education: 16   Highest education level: Patent examiner History   Occupation: Retired Charity fundraiser    Comment: retired  Tobacco Use   Smoking status: Never   Smokeless tobacco: Never  Vaping Use   Vaping status: Never Used  Substance and Sexual Activity   Alcohol use: Yes    Alcohol/week: 1.0 standard drink of alcohol    Types: 1 Cans of beer per week    Comment: occasionally   Drug use: Never   Sexual activity: Not Currently  Other Topics Concern   Not on file  Social History Narrative    Lives with wife. Like to square dance, photography and travels doing that when able.   Social Drivers of Corporate investment banker Strain: Low Risk  (11/07/2023)   Overall Financial Resource Strain (CARDIA)    Difficulty of Paying Living Expenses: Not hard at all  Food Insecurity: No Food Insecurity (11/07/2023)   Hunger Vital Sign    Worried About Running Out of Food in the Last Year: Never true    Ran Out of Food in the Last Year: Never true  Transportation Needs: No Transportation Needs (11/07/2023)   PRAPARE - Administrator, Civil Service (Medical): No    Lack of Transportation (Non-Medical): No  Physical Activity: Sufficiently Active (11/07/2023)   Exercise Vital Sign    Days of Exercise per Week: 6 days    Minutes of Exercise per Session: 30 min  Stress: No Stress Concern Present (11/07/2023)   [employer] of Occupational Health - Occupational Stress Questionnaire    Feeling of Stress : Not at all  Social Connections: Socially Integrated (11/07/2023)   Social Connection and Isolation Panel [NHANES]    Frequency of Communication with Friends and Family: More than three times a week    Frequency of Social Gatherings with Friends and Family: More than three times a week  Attends Religious Services: More than 4 times per year    Active Member of Clubs or Organizations: Yes    Attends Banker Meetings: More than 4 times per year    Marital Status: Married    Family History  Problem Relation Age of Onset   Hypertension Mother    Cancer Father    Hearing loss Father     Health Maintenance  Topic Date Due   Pneumonia Vaccine 104+ Years old (1 of 1 - PCV) Never done   [employer] Wellness (AWV)  05/26/2023   COVID-19 Vaccine (3 - 2024-25 season) 11/24/2023 (Originally 06/13/2023)   INFLUENZA VACCINE  01/10/2024 (Originally 05/13/2023)   Zoster Vaccines- Shingrix (1 of 2) 01/13/2024 (Originally 06/14/1993)   HPV VACCINES  Aged Out   DTaP/Tdap/Td   Discontinued     ----------------------------------------------------------------------------------------------------------------------------------------------------------------------------------------------------------------- Physical Exam BP 130/71   Pulse 64   Ht 5\' 10"  (1.778 m)   Wt 185 lb (83.9 kg)   SpO2 96%   BMI 26.54 kg/m   Physical Exam Constitutional:      Appearance: Normal appearance.  HENT:     Head: Normocephalic and atraumatic.  Eyes:     General: No scleral icterus. Cardiovascular:     Rate and Rhythm: Normal rate and regular rhythm.  Pulmonary:     Effort: Pulmonary effort is normal.     Breath sounds: Normal breath sounds.  Musculoskeletal:     Cervical back: Neck supple.  Neurological:     Mental Status: He is alert.  Psychiatric:        Mood and Affect: Mood normal.        Behavior: Behavior normal.     ------------------------------------------------------------------------------------------------------------------------------------------------------------------------------------------------------------------- Assessment and Plan  Hypertension BP is well controlled at this time.  He will continue amlodipine at current strength. F/u in 1 year.   Hyperlipidemia Update lipid panel   Abnormal TSH Repeat TSH + T4   No orders of the defined types were placed in this encounter.   Return in about 1 year (around 11/07/2024) for Hypertension.    This visit occurred during the SARS-CoV-2 public health emergency.  Safety protocols were in place, including screening questions prior to the visit, additional usage of staff PPE, and extensive cleaning of exam room while observing appropriate contact time as indicated for disinfecting solutions.

## 2023-11-08 NOTE — Assessment & Plan Note (Signed)
Update lipid panel.

## 2023-11-08 NOTE — Assessment & Plan Note (Signed)
BP is well controlled at this time.  He will continue amlodipine at current strength. F/u in 1 year.

## 2023-11-08 NOTE — Assessment & Plan Note (Signed)
Repeat TSH/T4

## 2023-11-09 LAB — CBC WITH DIFFERENTIAL/PLATELET
Basophils Absolute: 0 10*3/uL (ref 0.0–0.2)
Basos: 1 %
EOS (ABSOLUTE): 0.2 10*3/uL (ref 0.0–0.4)
Eos: 4 %
Hematocrit: 47 % (ref 37.5–51.0)
Hemoglobin: 15.9 g/dL (ref 13.0–17.7)
Immature Grans (Abs): 0 10*3/uL (ref 0.0–0.1)
Immature Granulocytes: 0 %
Lymphocytes Absolute: 1.4 10*3/uL (ref 0.7–3.1)
Lymphs: 26 %
MCH: 31.6 pg (ref 26.6–33.0)
MCHC: 33.8 g/dL (ref 31.5–35.7)
MCV: 93 fL (ref 79–97)
Monocytes Absolute: 0.5 10*3/uL (ref 0.1–0.9)
Monocytes: 8 %
Neutrophils Absolute: 3.4 10*3/uL (ref 1.4–7.0)
Neutrophils: 61 %
Platelets: 222 10*3/uL (ref 150–450)
RBC: 5.03 x10E6/uL (ref 4.14–5.80)
RDW: 13.1 % (ref 11.6–15.4)
WBC: 5.6 10*3/uL (ref 3.4–10.8)

## 2023-11-09 LAB — TSH+FREE T4
Free T4: 1.17 ng/dL (ref 0.82–1.77)
TSH: 2.95 u[IU]/mL (ref 0.450–4.500)

## 2023-11-09 LAB — LIPID PANEL WITH LDL/HDL RATIO
Cholesterol, Total: 189 mg/dL (ref 100–199)
HDL: 51 mg/dL (ref >39)
LDL Chol Calc (NIH): 120 mg/dL — ABNORMAL HIGH (ref 0–99)
LDL/HDL Ratio: 2.4 {ratio} (ref 0.0–3.6)
Triglycerides: 99 mg/dL (ref 0–149)
VLDL Cholesterol Cal: 18 mg/dL (ref 5–40)

## 2023-11-09 LAB — CMP14+EGFR
ALT: 8 [IU]/L (ref 0–44)
AST: 16 [IU]/L (ref 0–40)
Albumin: 4 g/dL (ref 3.8–4.8)
Alkaline Phosphatase: 84 [IU]/L (ref 44–121)
BUN/Creatinine Ratio: 11 (ref 10–24)
BUN: 12 mg/dL (ref 8–27)
Bilirubin Total: 0.4 mg/dL (ref 0.0–1.2)
CO2: 24 mmol/L (ref 20–29)
Calcium: 9.1 mg/dL (ref 8.6–10.2)
Chloride: 104 mmol/L (ref 96–106)
Creatinine, Ser: 1.13 mg/dL (ref 0.76–1.27)
Globulin, Total: 2.2 g/dL (ref 1.5–4.5)
Glucose: 96 mg/dL (ref 70–99)
Potassium: 4.3 mmol/L (ref 3.5–5.2)
Sodium: 140 mmol/L (ref 134–144)
Total Protein: 6.2 g/dL (ref 6.0–8.5)
eGFR: 66 mL/min/{1.73_m2} (ref >59)

## 2023-11-11 ENCOUNTER — Encounter: Payer: Self-pay | Admitting: Family Medicine

## 2023-11-19 ENCOUNTER — Encounter: Payer: Self-pay | Admitting: Family Medicine

## 2024-02-07 DIAGNOSIS — Z961 Presence of intraocular lens: Secondary | ICD-10-CM | POA: Diagnosis not present

## 2024-02-07 DIAGNOSIS — H401121 Primary open-angle glaucoma, left eye, mild stage: Secondary | ICD-10-CM | POA: Diagnosis not present

## 2024-02-07 DIAGNOSIS — H26491 Other secondary cataract, right eye: Secondary | ICD-10-CM | POA: Diagnosis not present

## 2024-02-07 DIAGNOSIS — H401112 Primary open-angle glaucoma, right eye, moderate stage: Secondary | ICD-10-CM | POA: Diagnosis not present

## 2024-02-07 DIAGNOSIS — H18513 Endothelial corneal dystrophy, bilateral: Secondary | ICD-10-CM | POA: Diagnosis not present

## 2024-02-07 DIAGNOSIS — H04123 Dry eye syndrome of bilateral lacrimal glands: Secondary | ICD-10-CM | POA: Diagnosis not present

## 2024-02-09 DIAGNOSIS — H26491 Other secondary cataract, right eye: Secondary | ICD-10-CM | POA: Diagnosis not present

## 2024-03-31 ENCOUNTER — Ambulatory Visit
Admission: EM | Admit: 2024-03-31 | Discharge: 2024-03-31 | Disposition: A | Discharge status: Home / Self Care | Attending: Nurse Practitioner | Admitting: Nurse Practitioner

## 2024-03-31 DIAGNOSIS — S61210A Laceration without foreign body of right index finger without damage to nail, initial encounter: Secondary | ICD-10-CM | POA: Diagnosis not present

## 2024-03-31 NOTE — Discharge Instructions (Addendum)
 You were seen today for a cut, also called a laceration. A laceration is an injury that can go through all layers of the skin and sometimes into the tissue underneath. Some cuts can heal on their own, but others need to be closed to help them heal properly and lower the risk of infection. Your cut was treated with skin glue, which holds the skin together and helps the wound heal faster. The skin glue will fall off on its own as the wound heals, usually within 7 to 10 days. It is important to care for your wound carefully. Keep the dressing on for the next 24 hours and do not let it get wet. After 24 hours, you can remove the dressing but leave the skin glue in place. Do not scratch, rub, or pick at the glue. Avoid putting tape, ointments, creams, disinfectants, or liquid medicines on the wound, as these can interfere with healing. Please return here or follow up with your healthcare provider if you notice increased redness, swelling, pus, or worsening pain.

## 2024-03-31 NOTE — ED Provider Notes (Signed)
 [facility]    CSN: [id number] Arrival date & time: 03/31/24  1940      History   Chief Complaint Chief Complaint  Patient presents with   Laceration    HPI Cody Stephens is a 83 y.o. male.   Cody Stephens is a 83 y.o. male that presents with a laceration on his finger that occurred today while deadheading flowers. The injury happened around 7:00 PM, approximately one hour before the visit. The patient reports that he was trying to avoid cutting a flower he didn't want to deadhead, which resulted in cutting his right index finger instead. The patient denies any numbness or tingling in the affected finger. He is not currently taking aspirin or blood thinners. The patient's last tetanus shot was more than 5 years ago, and he reports having had a reaction to a previous tetanus booster, though he does not remember the specific nature of the reaction.  The following portions of the patient's history were reviewed and updated as appropriate: allergies, current medications, past family history, past medical history, past social history, past surgical history, and problem list.    Past Medical History:  Diagnosis Date   Abnormal TSH 12/18/2020   Colon cancer screening declined 02/28/2018   Elevated hemoglobin (HCC) 05/06/2023   Glaucoma    Hyperlipidemia 12/18/2020   Hypertension    on meds   Hypertension goal BP (blood pressure) < 140/90 02/28/2018   Nasal congestion 11/24/2018   Palpitations 11/17/2018   Prepatellar bursitis, left knee 03/04/2022   Refused pneumococcal vaccination 02/28/2018   Wears hearing aid in both ears 02/28/2018    Patient Active Problem List   Diagnosis Date Noted   Elevated hemoglobin (HCC) 05/06/2023   Prepatellar bursitis, left knee 03/04/2022   Hypertension    Abnormal TSH 12/18/2020   Hyperlipidemia 12/18/2020   Nasal congestion 11/24/2018   Palpitations 11/17/2018   Hypertension goal BP (blood pressure) < 140/90 02/28/2018   Wears  hearing aid in both ears 02/28/2018   Colon cancer screening declined 02/28/2018   Refused pneumococcal vaccination 02/28/2018   Glaucoma 02/28/2018    Past Surgical History:  Procedure Laterality Date   DENTAL SURGERY  2016   implant   EYE SURGERY  Feb 2021   Cataract surgery       Home Medications    Prior to Admission medications   Medication Sig Start Date End Date Taking? Authorizing Provider  amLODipine  (NORVASC ) 10 MG tablet Take 1 tablet (10 mg total) by mouth daily. 05/06/23  Yes Adela Holter, DO  latanoprost (XALATAN) 0.005 % ophthalmic solution Place 1 drop into both eyes at bedtime.   Yes [provider]    Family History Family History  Problem Relation Age of Onset   Hypertension Mother    Cancer Father    Hearing loss Father     Social History Social History   Tobacco Use   Smoking status: Never   Smokeless tobacco: Never  Vaping Use   Vaping status: Never Used  Substance Use Topics   Alcohol use: Yes    Alcohol/week: 1.0 standard drink of alcohol    Types: 1 Cans of beer per week    Comment: occasionally   Drug use: Never     Allergies   Tetanus-diphth-acell pertussis   Review of Systems Review of Systems  Skin:  Positive for wound.  Neurological:  Negative for weakness and numbness.  All other systems reviewed and are negative.    Physical  Exam Triage Vital Signs ED Triage Vitals  Encounter Vitals Group     BP 03/31/24 1956 (!) 150/86     Girls Systolic BP Percentile --      Girls Diastolic BP Percentile --      Boys Systolic BP Percentile --      Boys Diastolic BP Percentile --      Pulse Rate 03/31/24 1956 75     Resp 03/31/24 1956 17     Temp 03/31/24 1956 98 F (36.7 C)     Temp Source 03/31/24 1956 Oral     SpO2 03/31/24 1956 93 %     Weight 03/31/24 1955 177 lb (80.3 kg)     Height 03/31/24 1955 5' 10 (1.778 m)     Head Circumference --      Peak Flow --      Pain Score 03/31/24 1955 0     Pain Loc --       Pain Education --      Exclude from Growth Chart --    No data found.  Updated Vital Signs BP (!) 150/86 (BP Location: Left Arm)   Pulse 75   Temp 98 F (36.7 C) (Oral)   Resp 17   Ht 5' 10 (1.778 m)   Wt 177 lb (80.3 kg)   SpO2 93%   BMI 25.40 kg/m   Visual Acuity Right Eye Distance:   Left Eye Distance:   Bilateral Distance:    Right Eye Near:   Left Eye Near:    Bilateral Near:     Physical Exam Vitals reviewed.  Constitutional:      General: He is not in acute distress.    Appearance: Normal appearance. He is not toxic-appearing.  HENT:     Head: Normocephalic.     Mouth/Throat:     Mouth: Mucous membranes are moist.   Eyes:     Conjunctiva/sclera: Conjunctivae normal.    Cardiovascular:     Rate and Rhythm: Normal rate and regular rhythm.     Heart sounds: Normal heart sounds.  Pulmonary:     Effort: Pulmonary effort is normal.     Breath sounds: Normal breath sounds.   Musculoskeletal:        General: Normal range of motion.   Skin:    General: Skin is warm and dry.     Findings: Laceration present.     Comments: See picture below    Neurological:     General: No focal deficit present.     Mental Status: He is alert and oriented to person, place, and time.      UC Treatments / Results  Labs (all labs ordered are listed, but only abnormal results are displayed) Labs Reviewed - No data to display  EKG   Radiology No results found.  Procedures Laceration Repair  Date/Time: 03/31/2024 8:23 PM  Performed by: Maryruth Sol, FNP Authorized by: Maryruth Sol, FNP   Consent:    Consent obtained:  Verbal   Consent given by:  Patient   Risks, benefits, and alternatives were discussed: yes     Risks discussed:  Infection, pain, poor cosmetic result, need for additional repair and poor wound healing Universal protocol:    Patient identity confirmed:  Verbally with patient and arm band Anesthesia:    Anesthesia method:   None Laceration details:    Location:  Finger   Finger location:  R index finger Treatment:    Area cleansed with:  Chlorhexidine  Amount of cleaning:  Standard Skin repair:    Repair method:  Tissue adhesive Approximation:    Approximation:  Close Repair type:    Repair type:  Simple Post-procedure details:    Dressing:  Non-adherent dressing   Procedure completion:  Tolerated well, no immediate complications  (including critical care time)  Medications Ordered in UC Medications - No data to display  Initial Impression / Assessment and Plan / UC Course  I have reviewed the triage vital signs and the nursing notes.  Pertinent labs & imaging results that were available during my care of the patient were reviewed by me and considered in my medical decision making (see chart for details).     Patient presents with a laceration to the right index finger. He denies numbness, tingling, or functional impairment in the affected area. There is no history of anticoagulant use. The patient has a documented history of adverse reaction to tetanus vaccination and is unable to receive it. The wound was cleaned and dried, and Dermabond was applied for closure. A nonadherent dressing was placed over the site. The patient was instructed to keep the dressing in place for 24 hours and remove it around 8:20 PM tomorrow. After removal, the wound should be kept clean and dry. No creams or topical ointments should be applied to the area. Patient was advised to monitor for signs of infection including redness, swelling, warmth, drainage, or fever. Follow up with PCP if concerns arise. Seek emergency care if the wound reopens, bleeding resumes, or if signs of infection develop.  Today's evaluation has revealed no signs of a dangerous process. Discussed diagnosis with patient and/or guardian. Patient and/or guardian aware of their diagnosis, possible red flag symptoms to watch out for and need for close follow  up. Patient and/or guardian understands verbal and written discharge instructions. Patient and/or guardian comfortable with plan and disposition.  Patient and/or guardian has a clear mental status at this time, good insight into illness (after discussion and teaching) and has clear judgment to make decisions regarding their care  Documentation was completed with the aid of voice recognition software. Transcription may contain typographical errors. Final Clinical Impressions(s) / UC Diagnoses   Final diagnoses:  Laceration of right index finger without foreign body without damage to nail, initial encounter     Discharge Instructions      You were seen today for a cut, also called a laceration. A laceration is an injury that can go through all layers of the skin and sometimes into the tissue underneath. Some cuts can heal on their own, but others need to be closed to help them heal properly and lower the risk of infection. Your cut was treated with skin glue, which holds the skin together and helps the wound heal faster. The skin glue will fall off on its own as the wound heals, usually within 7 to 10 days. It is important to care for your wound carefully. Keep the dressing on for the next 24 hours and do not let it get wet. After 24 hours, you can remove the dressing but leave the skin glue in place. Do not scratch, rub, or pick at the glue. Avoid putting tape, ointments, creams, disinfectants, or liquid medicines on the wound, as these can interfere with healing. Please return here or follow up with your healthcare provider if you notice increased redness, swelling, pus, or worsening pain.      ED Prescriptions   None    PDMP not reviewed  this encounter.   Maryruth Sol, [place] 03/31/24 2027

## 2024-03-31 NOTE — ED Triage Notes (Signed)
Pt states that he has a laceration to his right index finger. X1 day

## 2024-04-20 ENCOUNTER — Other Ambulatory Visit: Payer: Self-pay

## 2024-04-20 DIAGNOSIS — I10 Essential (primary) hypertension: Secondary | ICD-10-CM

## 2024-04-20 MED ORDER — AMLODIPINE BESYLATE 10 MG PO TABS: 10.0000 mg | ORAL_TABLET | Freq: Every day | ORAL | 3 refills | Status: AC

## 2024-06-05 DIAGNOSIS — H18513 Endothelial corneal dystrophy, bilateral: Secondary | ICD-10-CM | POA: Diagnosis not present

## 2024-06-05 DIAGNOSIS — H401112 Primary open-angle glaucoma, right eye, moderate stage: Secondary | ICD-10-CM | POA: Diagnosis not present

## 2024-06-05 DIAGNOSIS — H04123 Dry eye syndrome of bilateral lacrimal glands: Secondary | ICD-10-CM | POA: Diagnosis not present

## 2024-06-05 DIAGNOSIS — H401121 Primary open-angle glaucoma, left eye, mild stage: Secondary | ICD-10-CM | POA: Diagnosis not present

## 2024-06-13 ENCOUNTER — Encounter: Payer: Self-pay | Admitting: Sports Medicine

## 2024-08-15 ENCOUNTER — Encounter: Payer: Self-pay | Admitting: Cardiology

## 2024-08-15 ENCOUNTER — Ambulatory Visit: Attending: Cardiology | Admitting: Cardiology

## 2024-08-15 VITALS — BP 134/72 | HR 72 | Ht 70.6 in | Wt 185.0 lb

## 2024-08-15 DIAGNOSIS — I10 Essential (primary) hypertension: Secondary | ICD-10-CM | POA: Diagnosis not present

## 2024-08-15 DIAGNOSIS — R011 Cardiac murmur, unspecified: Secondary | ICD-10-CM | POA: Diagnosis not present

## 2024-08-15 NOTE — Progress Notes (Signed)
 Cardiology Office Note:    Date:  08/15/2024   ID:  Cody Stephens, DOB 08-Aug-1941, MRN [id number]  PCP:  Alvia Bring, DO  Cardiologist:  Jennifer JONELLE Crape, MD   Referring MD: Alvia Bring, DO    ASSESSMENT:    1. Essential hypertension   2. Cardiac murmur    PLAN:    In order of problems listed above:  Primary prevention stressed with the patient.  Importance of compliance with diet medication stressed and patient verbalized standing. He has excellent effort tolerance and I congratulated him about his exercise protocol. Essential hypertension: Blood pressure is stable and diet was emphasized.  Lifestyle modification urged.  Diet and exercise stressed and he is doing very well with this. Cardiac murmur: Echocardiogram will be done to assess murmur heard on auscultation. Lipids were reviewed from [facility] sheet and discussed and they are fine. Patient will be seen in follow-up appointment in 6 months or earlier if the patient has any concerns.    Medication Adjustments/Labs and Tests Ordered: Current medicines are reviewed at length with the patient today.  Concerns regarding medicines are outlined above.  Orders Placed This Encounter  Procedures   EKG 12-Lead   No orders of the defined types were placed in this encounter.    No chief complaint on file.    History of Present Illness:    Cody Stephens is a 83 y.o. male.  Patient has past medical history of essential hypertension.  He denies any problems at this time and takes care of activities of daily living.  No chest pain orthopnea or PND.  At the time of my evaluation, the patient is alert awake oriented and in no distress.  He exercises well on a regular basis and is involved in multiple activities including dancing.  He is asymptomatic with this.  At the time of my evaluation, the patient is alert awake oriented and in no distress.  Past Medical History:  Diagnosis Date   Abnormal TSH 12/18/2020   Cardiac murmur  08/15/2024   Colon cancer screening declined 02/28/2018   Elevated hemoglobin 05/06/2023   Glaucoma    Hyperlipidemia 12/18/2020   Hypertension    on meds   Hypertension goal BP (blood pressure) < 140/90 02/28/2018   Nasal congestion 11/24/2018   Palpitations 11/17/2018   Prepatellar bursitis, left knee 03/04/2022   Refused pneumococcal vaccination 02/28/2018   Wears hearing aid in both ears 02/28/2018    Past Surgical History:  Procedure Laterality Date   DENTAL SURGERY  2016   implant   EYE SURGERY  Feb 2021   Cataract surgery    Current Medications: Current Meds  Medication Sig   amLODipine  (NORVASC ) 10 MG tablet Take 1 tablet (10 mg total) by mouth daily.   latanoprost (XALATAN) 0.005 % ophthalmic solution Place 1 drop into both eyes at bedtime.     Allergies:   Tetanus-diphth-acell pertussis   Social History   Socioeconomic History   Marital status: Married    Spouse name: Rudell   Number of children: 3   Years of education: 16   Highest education level: Patent Examiner History   Occupation: Retired Charity Fundraiser    Comment: retired  Tobacco Use   Smoking status: Never   Smokeless tobacco: Never  Vaping Use   Vaping status: Never Used  Substance and Sexual Activity   Alcohol use: Yes    Alcohol/week: 1.0 standard drink of alcohol    Types: 1 Cans of beer per  week    Comment: occasionally   Drug use: Never   Sexual activity: Not Currently  Other Topics Concern   Not on file  Social History Narrative   Lives with wife. Like to square dance, photography and travels doing that when able.   Social Drivers of Corporate Investment Banker Strain: Low Risk  (11/07/2023)   Overall Financial Resource Strain (CARDIA)    Difficulty of Paying Living Expenses: Not hard at all  Food Insecurity: No Food Insecurity (11/07/2023)   Hunger Vital Sign    Worried About Running Out of Food in the Last Year: Never true    Ran Out of Food in the Last Year: Never true   Transportation Needs: No Transportation Needs (11/07/2023)   PRAPARE - Administrator, Civil Service (Medical): No    Lack of Transportation (Non-Medical): No  Physical Activity: Sufficiently Active (11/07/2023)   Exercise Vital Sign    Days of Exercise per Week: 6 days    Minutes of Exercise per Session: 30 min  Stress: No Stress Concern Present (11/07/2023)   [employer] of Occupational Health - Occupational Stress Questionnaire    Feeling of Stress : Not at all  Social Connections: Socially Integrated (11/07/2023)   Social Connection and Isolation Panel    Frequency of Communication with Friends and Family: More than three times a week    Frequency of Social Gatherings with Friends and Family: More than three times a week    Attends Religious Services: More than 4 times per year    Active Member of [employer] or Organizations: Yes    Attends Engineer, Structural: More than 4 times per year    Marital Status: Married     Family History: The patient's family history includes Cancer in his father; Hearing loss in his father; Hypertension in his mother.  ROS:   Please see the history of present illness.    All other systems reviewed and are negative.  EKGs/Labs/Other Studies Reviewed:    The following studies were reviewed today: .SABRAEKG Interpretation Date/Time:  Tuesday August 15 2024 10:02:31 EST Ventricular Rate:  72 PR Interval:  116 QRS Duration:  84 QT Interval:  370 QTC Calculation: 405 R Axis:   55  Text Interpretation: Sinus rhythm with occasional Premature ventricular complexes Possible Inferior infarct , age undetermined Possible Anterior infarct (cited on or before 02-Aug-2023) When compared with ECG of 02-Aug-2023 12:56, Premature ventricular complexes are now Present Borderline criteria for Inferior infarct are now Present ST no longer elevated in Inferior leads Confirmed by Edwyna Backers [phone]) on 08/15/2024 10:19:12 AM     Recent  Labs: 11/08/2023: ALT 8; BUN 12; Creatinine, Ser 1.13; Hemoglobin 15.9; Platelets 222; Potassium 4.3; Sodium 140; TSH 2.950  Recent Lipid Panel    Component Value Date/Time   CHOL 189 11/08/2023 1032   TRIG 99 11/08/2023 1032   HDL 51 11/08/2023 1032   CHOLHDL 4.0 12/19/2020 0000   LDLCALC 120 (H) 11/08/2023 1032   LDLCALC 136 (H) 12/19/2020 0000    Physical Exam:    VS:  BP 134/72   Pulse 72   Ht 5' 10.6 (1.793 m)   Wt 185 lb (83.9 kg)   SpO2 96%   BMI 26.10 kg/m     Wt Readings from Last 3 Encounters:  08/15/24 185 lb (83.9 kg)  03/31/24 177 lb (80.3 kg)  11/08/23 185 lb (83.9 kg)     GEN: Patient is in no acute distress  HEENT: Normal NECK: No JVD; No carotid bruits LYMPHATICS: No lymphadenopathy CARDIAC: Hear sounds regular, 2/6 systolic murmur at the apex. RESPIRATORY:  Clear to auscultation without rales, wheezing or rhonchi  ABDOMEN: Soft, non-tender, non-distended MUSCULOSKELETAL:  No edema; No deformity  SKIN: Warm and dry NEUROLOGIC:  Alert and oriented x 3 PSYCHIATRIC:  Normal affect   Signed, Jennifer JONELLE Crape, MD  08/15/2024 10:26 AM    [facility]

## 2024-08-15 NOTE — Patient Instructions (Signed)
 Medication Instructions:  Your physician recommends that you continue on your current medications as directed. Please refer to the Current Medication list given to you today.  *If you need a refill on your cardiac medications before your next appointment, please call your pharmacy*   Lab Work: None ordered If you have labs (blood work) drawn today and your tests are completely normal, you will receive your results only by: MyChart Message (if you have MyChart) OR A paper copy in the mail If you have any lab test that is abnormal or we need to change your treatment, we will call you to review the results.   Testing/Procedures: Your physician has requested that you have an echocardiogram. Echocardiography is a painless test that uses sound waves to create images of your heart. It provides your doctor with information about the size and shape of your heart and how well your heart's chambers and valves are working. This procedure takes approximately one hour. There are no restrictions for this procedure. Please do NOT wear cologne, perfume, aftershave, or lotions (deodorant is allowed). Please arrive 15 minutes prior to your appointment time.  Please note: We ask at that you not bring children with you during ultrasound (echo/ vascular) testing. Due to room size and safety concerns, children are not allowed in the ultrasound rooms during exams. Our front office staff cannot provide observation of children in our lobby area while testing is being conducted. An adult accompanying a patient to their appointment will only be allowed in the ultrasound room at the discretion of the ultrasound technician under special circumstances. We apologize for any inconvenience.    Follow-Up: At [facility], you and your health needs are our priority.  As part of our continuing mission to provide you with exceptional heart care, we have created designated Provider Care Teams.  These Care Teams include your  primary Cardiologist (physician) and Advanced Practice Providers (APPs -  Physician Assistants and Nurse Practitioners) who all work together to provide you with the care you need, when you need it.  We recommend signing up for the patient portal called "MyChart".  Sign up information is provided on this After Visit Summary.  MyChart is used to connect with patients for Virtual Visits (Telemedicine).  Patients are able to view lab/test results, encounter notes, upcoming appointments, etc.  Non-urgent messages can be sent to your provider as well.   To learn more about what you can do with MyChart, go to [email or web address].    Your next appointment:   12 month(s)  The format for your next appointment:   In Person  Provider:   Hillis Lu, MD   Other Instructions Echocardiogram An echocardiogram is a test that uses sound waves (ultrasound) to produce images of the heart. Images from an echocardiogram can provide important information about: Heart size and shape. The size and thickness and movement of your heart's walls. Heart muscle function and strength. Heart valve function or if you have stenosis. Stenosis is when the heart valves are too narrow. If blood is flowing backward through the heart valves (regurgitation). A tumor or infectious growth around the heart valves. Areas of heart muscle that are not working well because of poor blood flow or injury from a heart attack. Aneurysm detection. An aneurysm is a weak or damaged part of an artery wall. The wall bulges out from the normal force of blood pumping through the body. Tell a health care provider about: Any allergies you have. All medicines you are taking,  including vitamins, herbs, eye drops, creams, and over-the-counter medicines. Any blood disorders you have. Any surgeries you have had. Any medical conditions you have. Whether you are pregnant or may be pregnant. What are the risks? Generally, this is a safe test.  However, problems may occur, including an allergic reaction to dye (contrast) that may be used during the test. What happens before the test? No specific preparation is needed. You may eat and drink normally. What happens during the test? You will take off your clothes from the waist up and put on a hospital gown. Electrodes or electrocardiogram (ECG)patches may be placed on your chest. The electrodes or patches are then connected to a device that monitors your heart rate and rhythm. You will lie down on a table for an ultrasound exam. A gel will be applied to your chest to help sound waves pass through your skin. A handheld device, called a transducer, will be pressed against your chest and moved over your heart. The transducer produces sound waves that travel to your heart and bounce back (or "echo" back) to the transducer. These sound waves will be captured in real-time and changed into images of your heart that can be viewed on a video monitor. The images will be recorded on a computer and reviewed by your health care provider. You may be asked to change positions or hold your breath for a short time. This makes it easier to get different views or better views of your heart. In some cases, you may receive contrast through an IV in one of your veins. This can improve the quality of the pictures from your heart. The procedure may vary among health care providers and hospitals.   What can I expect after the test? You may return to your normal, everyday life, including diet, activities, and medicines, unless your health care provider tells you not to do that. Follow these instructions at home: It is up to you to get the results of your test. Ask your health care provider, or the department that is doing the test, when your results will be ready. Keep all follow-up visits. This is important. Summary An echocardiogram is a test that uses sound waves (ultrasound) to produce images of the heart. Images  from an echocardiogram can provide important information about the size and shape of your heart, heart muscle function, heart valve function, and other possible heart problems. You do not need to do anything to prepare before this test. You may eat and drink normally. After the echocardiogram is completed, you may return to your normal, everyday life, unless your health care provider tells you not to do that. This information is not intended to replace advice given to you by your health care provider. Make sure you discuss any questions you have with your health care provider. Document Revised: 05/21/2020 Document Reviewed: 05/21/2020 Elsevier Patient Education  2021 Elsevier Inc.   Important Information About Sugar

## 2024-08-31 ENCOUNTER — Ambulatory Visit (HOSPITAL_BASED_OUTPATIENT_CLINIC_OR_DEPARTMENT_OTHER)
Admission: RE | Admit: 2024-08-31 | Discharge: 2024-08-31 | Disposition: A | Discharge status: Home / Self Care | Source: Ambulatory Visit | Attending: Cardiology | Admitting: Cardiology

## 2024-08-31 DIAGNOSIS — R011 Cardiac murmur, unspecified: Secondary | ICD-10-CM | POA: Diagnosis present

## 2024-09-01 ENCOUNTER — Ambulatory Visit: Payer: Self-pay | Admitting: Cardiology

## 2024-09-01 LAB — ECHOCARDIOGRAM COMPLETE
AR max vel: 2.84 cm2
AV Area VTI: 2.89 cm2
AV Area mean vel: 2.71 cm2
AV Mean grad: 5 mmHg
AV Peak grad: 10.2 mmHg
AV Vena cont: 0.3 cm
Ao pk vel: 1.6 m/s
Area-P 1/2: 2.5 cm2
Calc EF: 67.5 %
MV M vel: 5.09 m/s
MV Peak grad: 103.6 mmHg
S' Lateral: 2.2 cm
Single Plane A2C EF: 68.4 %
Single Plane A4C EF: 65 %

## 2024-11-07 ENCOUNTER — Ambulatory Visit: Payer: Medicare Other | Admitting: Family Medicine

## 2024-11-10 ENCOUNTER — Other Ambulatory Visit: Payer: Self-pay | Admitting: Medical Genetics

## 2024-11-10 ENCOUNTER — Encounter: Payer: Self-pay | Admitting: Family Medicine

## 2024-11-13 ENCOUNTER — Ambulatory Visit: Admitting: Family Medicine

## 2024-11-16 ENCOUNTER — Ambulatory Visit: Admitting: Family Medicine

## 2024-11-21 ENCOUNTER — Ambulatory Visit: Admitting: Family Medicine
